# Patient Record
Sex: Female | Born: 1970
Health system: Southern US, Community
[De-identification: ages and names within clinical notes are randomized; demographics above are authoritative.]

## PROBLEM LIST (undated history)

## (undated) DIAGNOSIS — J45909 Unspecified asthma, uncomplicated: Secondary | ICD-10-CM

## (undated) DIAGNOSIS — K219 Gastro-esophageal reflux disease without esophagitis: Secondary | ICD-10-CM

## (undated) HISTORY — PX: CHOLECYSTECTOMY: SHX55

## (undated) HISTORY — PX: TONSILLECTOMY: SUR1361

## (undated) HISTORY — PX: HAND SURGERY: SHX662

---

## 1998-02-06 ENCOUNTER — Inpatient Hospital Stay (HOSPITAL_COMMUNITY): Admission: AD | Admit: 1998-02-06 | Discharge: 1998-02-10 | Payer: Self-pay | Admitting: Obstetrics & Gynecology

## 1998-02-20 ENCOUNTER — Encounter: Admission: RE | Admit: 1998-02-20 | Discharge: 1998-04-01 | Payer: Self-pay | Admitting: Obstetrics & Gynecology

## 1998-09-12 ENCOUNTER — Encounter: Payer: Self-pay | Admitting: Neurosurgery

## 1998-09-12 ENCOUNTER — Ambulatory Visit (HOSPITAL_COMMUNITY): Admission: RE | Admit: 1998-09-12 | Discharge: 1998-09-12 | Payer: Self-pay | Admitting: Neurosurgery

## 1999-04-06 ENCOUNTER — Other Ambulatory Visit: Admission: RE | Admit: 1999-04-06 | Discharge: 1999-04-06 | Payer: Self-pay | Admitting: Obstetrics & Gynecology

## 2000-04-06 ENCOUNTER — Other Ambulatory Visit: Admission: RE | Admit: 2000-04-06 | Discharge: 2000-04-06 | Payer: Self-pay | Admitting: Obstetrics & Gynecology

## 2000-06-05 ENCOUNTER — Emergency Department (HOSPITAL_COMMUNITY): Admission: EM | Admit: 2000-06-05 | Discharge: 2000-06-05 | Payer: Self-pay | Admitting: Emergency Medicine

## 2001-04-12 ENCOUNTER — Other Ambulatory Visit: Admission: RE | Admit: 2001-04-12 | Discharge: 2001-04-12 | Payer: Self-pay | Admitting: Obstetrics & Gynecology

## 2002-06-07 ENCOUNTER — Other Ambulatory Visit: Admission: RE | Admit: 2002-06-07 | Discharge: 2002-06-07 | Payer: Self-pay | Admitting: Obstetrics & Gynecology

## 2003-06-21 ENCOUNTER — Other Ambulatory Visit: Admission: RE | Admit: 2003-06-21 | Discharge: 2003-06-21 | Payer: Self-pay | Admitting: Obstetrics & Gynecology

## 2003-07-09 ENCOUNTER — Encounter: Admission: RE | Admit: 2003-07-09 | Discharge: 2003-07-09 | Payer: Self-pay | Admitting: Family Medicine

## 2003-07-09 ENCOUNTER — Encounter: Payer: Self-pay | Admitting: Family Medicine

## 2004-07-15 ENCOUNTER — Other Ambulatory Visit: Admission: RE | Admit: 2004-07-15 | Discharge: 2004-07-15 | Payer: Self-pay | Admitting: Obstetrics & Gynecology

## 2004-11-17 ENCOUNTER — Emergency Department (HOSPITAL_COMMUNITY): Admission: EM | Admit: 2004-11-17 | Discharge: 2004-11-17 | Payer: Self-pay | Admitting: Emergency Medicine

## 2007-01-18 ENCOUNTER — Ambulatory Visit (HOSPITAL_COMMUNITY): Admission: RE | Admit: 2007-01-18 | Discharge: 2007-01-18 | Payer: Self-pay | Admitting: *Deleted

## 2007-01-20 ENCOUNTER — Encounter: Admission: RE | Admit: 2007-01-20 | Discharge: 2007-01-20 | Payer: Self-pay | Admitting: *Deleted

## 2007-01-28 ENCOUNTER — Encounter (INDEPENDENT_AMBULATORY_CARE_PROVIDER_SITE_OTHER): Payer: Self-pay | Admitting: Specialist

## 2007-01-28 ENCOUNTER — Inpatient Hospital Stay (HOSPITAL_COMMUNITY): Admission: EM | Admit: 2007-01-28 | Discharge: 2007-01-29 | Payer: Self-pay | Admitting: Emergency Medicine

## 2009-12-10 ENCOUNTER — Emergency Department (HOSPITAL_COMMUNITY): Admission: EM | Admit: 2009-12-10 | Discharge: 2009-12-10 | Payer: Self-pay | Admitting: Emergency Medicine

## 2010-11-25 ENCOUNTER — Other Ambulatory Visit
Admission: RE | Admit: 2010-11-25 | Discharge: 2010-11-25 | Payer: Self-pay | Source: Home / Self Care | Admitting: Internal Medicine

## 2011-03-22 LAB — POCT I-STAT, CHEM 8
BUN: 7 mg/dL (ref 6–23)
Calcium, Ion: 1.18 mmol/L (ref 1.12–1.32)
Chloride: 105 meq/L (ref 96–112)
Creatinine, Ser: 0.6 mg/dL (ref 0.4–1.2)
Glucose, Bld: 94 mg/dL (ref 70–99)
HCT: 41 % (ref 36.0–46.0)
Hemoglobin: 13.9 g/dL (ref 12.0–15.0)
Potassium: 3.6 meq/L (ref 3.5–5.1)
Sodium: 141 mEq/L (ref 135–145)
TCO2: 30 mmol/L (ref 0–100)

## 2011-03-22 LAB — URINALYSIS, ROUTINE W REFLEX MICROSCOPIC
Bilirubin Urine: NEGATIVE
Glucose, UA: NEGATIVE mg/dL
Hgb urine dipstick: NEGATIVE
Protein, ur: NEGATIVE mg/dL
Urobilinogen, UA: 0.2 mg/dL (ref 0.0–1.0)

## 2011-05-07 NOTE — Op Note (Signed)
[name], [name]                  ACCOUNT NO.:  [id number]   MEDICAL RECORD NO.:  [id number]          PATIENT TYPE:  INP   LOCATION:  5727                         FACILITY:  [facility]   PHYSICIAN:  Gabrielle Dare. Janee Morn, M.D.DATE OF BIRTH:  September 17, 1971   DATE OF PROCEDURE:  01/28/2007  DATE OF DISCHARGE:  01/29/2007                               OPERATIVE REPORT   PREOPERATIVE DIAGNOSIS:  Acute cholecystitis.   POSTOP DIAGNOSIS:  Acute cholecystitis.   PROCEDURE:  Laparoscopic cholecystectomy with intraoperative  cholangiogram.   SURGEON:  Gabrielle Dare. Janee Morn, M.D.   ASSISTANT:  Thornton Park. Daphine Deutscher, MD   HISTORY OF PRESENT ILLNESS:  The patient was a 40 year old white female  who presented to the emergency department, today, with acute  cholecystitis.  She was brought for emergency cholecystectomy.   PROCEDURE IN DETAIL:  Informed consent was obtained.  The patient  received intravenous antibiotics.  She was brought to the operating  room.  General anesthesia was administered.  Her abdomen was prepped and  draped in a sterile fashion.  Infraumbilical area was infiltrated with  1/4% Marcaine with epinephrine.  An infraumbilical incision was made.  Subcutaneous tissues were dissected down revealing the anterior fascia.  This was divided sharply.  Peritoneal cavity was entered under direct  vision without difficulty.  A #0 Vicryl pursestring suture was placed  around the fascial opening; and the Hassan trocar was inserted into the  abdomen.  The abdomen was insufflated with carbon dioxide in the  standard fashion.  Laparoscopic exploration revealed some endometriosis  implants in the right lower quadrant.  Pictures were taken.   The dome of the gallbladder was retracted superomedially.  Note, the  infundibulum was retracted inferolaterally.  Dissection began laterally  and progressed medially, identifying the cystic duct with an overlying  cystic artery.  The cystic artery was clipped  twice proximally and once  distally and divided.  The cystic duct was circumferentially dissected  until a large window was created between the infundibulum; and the  cystic duct on the liver.  Once this was accomplished, with excellent  visualization, a clip was placed on the infundibulum cystic duct  junction.  Intraoperative cholangiogram was obtained.  This demonstrated  no common bile duct filling defects; and good flow of contrast into the  duodenum.  Cholangiogram catheter was removed.  Three clips were placed  proximally on the cystic duct; and it was divided.   The gallbladder was taken off the liver bed with Bovie cautery.  We  encountered posterior branch of the cystic artery.  This was clipped  twice proximally, once distally and divided.  Gallbladder was removed  from the liver bed with Bovie cautery.  Excellent hemostasis was  obtained along the way.  The gallbladder was placed in the EndoCatch  bag; and removed from the abdomen via the infraumbilical port site.  The  abdomen was copiously irrigated, irrigation and fluid returned clear.  The liver bed was checked; and there was no bleeding; it was dry.  The  clips remained in good position.  Ports were removed under  direct  vision.  After evacuating the remainder of the irrigation fluid, 1/4%  Marcaine was used at all port sites.  The pneumoperitoneum was released.  All 4 wounds were subsequently irrigated. The [facility] trocar was removed.  The infraumbilical fascia was closed by tying a #0 Vicryl pursestring  suture.  All four wounds were copiously irrigated.  Skin of each was  closed with a running 4-0 Vicryl  subcuticular stitch.  Sponge, needle, and instrument counts were  correct.  Benzoin, Steri-Strips and sterile dressings were applied.  The  patient tolerated the procedure well, without apparent complication; and  was taken recovery room in stable condition.      Gabrielle Dare Janee Morn, M.D.  Electronically  Signed     BET/MEDQ  D:  01/28/2007  T:  01/29/2007  Job:  [id number]

## 2011-05-07 NOTE — H&P (Signed)
[name], [name]                  ACCOUNT NO.:  [id number]   MEDICAL RECORD NO.:  [id number]          PATIENT TYPE:  INP   LOCATION:  5727                         FACILITY:  [facility]   PHYSICIAN:  Gabrielle Dare. Janee Morn, M.D.DATE OF BIRTH:  [birth year]   DATE OF ADMISSION:  01/28/2007  DATE OF DISCHARGE:                              HISTORY & PHYSICAL   CHIEF COMPLAINT:  Right upper quadrant pain, nausea and vomiting.   HISTORY OF PRESENT ILLNESS:  The patient is a 40 year old white female  who complains of episodic right upper quadrant pain for several years.  It has become worse over the past 3 weeks.  She had ultrasound done on  February 1 showing gallstones with mild gallbladder wall thickening.  She has also had some EGD workup with Dr. Virginia Rochester which she claims is  unremarkable.  She has been feeling poorly since last week.  The right  upper quadrant pain, nausea, and vomiting became worse last night. She  came to emergency department for evaluation.  Pain continued despite  medications in the emergency department, and we are asked to admit.   PAST MEDICAL HISTORY:  Hypothyroidism and asthma.   SOCIAL HISTORY:  She works as a Engineer, civil (consulting) at [employer]. She does not  smoke.  She does not drink alcohol.   ALLERGIES:  PENICILLIN.   MEDICATIONS:  None.  She has a thyroid medicine prescribed, but she does  not take it.   REVIEW OF SYSTEMS:  Is significant in the GI section for right upper  quadrant pain, nausea and vomiting, otherwise negative.   PHYSICAL EXAMINATION:  VITAL SIGNS: Temperature 98.2, pulse 93,  respirations 20, blood pressure 128/85.  GENERAL: She is awake, alert and in mild distress.  NECK:  Supple with no masses.  LUNGS:  Clear to auscultation with no acute wheezing.  HEART: Regular with no murmurs and pulses palpable in the left chest.  ABDOMEN:  Has some right upper quadrant tenderness without guarding.  Bowel sounds are present. It is soft. No other masses are noted.  EXTREMITIES:  Have no significant edema.  SKIN: Warm and dry with no rashes.   LABORATORY STUDIES:  White blood cell count 5.4. Lipase 21.  Liver  function tests within normal limits.  Sodium 140, potassium 3.5,  chloride 105, CO2 28, BUN 5, glucose 97.   IMPRESSION:  A 40 year old female with acute cholecystitis.   PLAN:  1. Admit.  2. Give her intravenous antibiotics.  3. Plan a cholecystectomy today.   Laparoscopic cholecystectomy with intraoperative cholangiogram will be  the plan. Procedure, risks and benefits were discussed in detail with  the patient including but not limited to bleeding, infection bowel and  bile duct Injury.  We also talked about the possibility emergent to over  procedure. She is agreeable.      Gabrielle Dare Janee Morn, M.D.  Electronically Signed     BET/MEDQ  D:  01/28/2007  T:  01/28/2007  Job:  [id number]

## 2011-05-07 NOTE — Op Note (Signed)
NAMEJANEVA, PEASTER                  ACCOUNT NO.:  [id number]   MEDICAL RECORD NO.:  [id number]          PATIENT TYPE:  AMB   LOCATION:  ENDO                         FACILITY:  [facility]   PHYSICIAN:  Georgiana Spinner, M.D.    DATE OF BIRTH:  06-Apr-1971   DATE OF PROCEDURE:  01/18/2007  DATE OF DISCHARGE:                               OPERATIVE REPORT   PROCEDURE:  Upper endoscopy.   INDICATIONS:  Abdominal pain.   ANESTHESIA:  Fentanyl 60 mcg, Versed 5 mg.   PROCEDURE:  With the patient mildly sedated in the left lateral  decubitus position, the Pentax videoscopic endoscope was inserted and  passed under direct vision through the esophagus, which appeared normal  into the stomach.  Fundus, body, antrum, duodenal bulb, second portion  duodenum were visualized and all appeared normal.  From this point the  endoscope was slowly withdrawn taking circumferential views of duodenal  mucosa until the endoscope been pulled back in the stomach, placed in  retroflexion to view the stomach from below.  The endoscope was  straightened and withdrawn taking circumferential views of the remaining  gastric and esophageal mucosa.  The patient's vital signs, pulse  oximeter remained stable.  The patient tolerated procedure well without  apparent complications.   FINDINGS:  Unremarkable examination.   PLAN:  Have patient follow-up with me as an outpatient.           ______________________________  Georgiana Spinner, M.D.     GMO/MEDQ  D:  01/18/2007  T:  01/18/2007  Job:  [id number]

## 2011-05-07 NOTE — Discharge Summary (Signed)
[name], [name]                  ACCOUNT NO.:  [id number]   MEDICAL RECORD NO.:  [id number]          PATIENT TYPE:  INP   LOCATION:  5727                         FACILITY:  [facility]   PHYSICIAN:  Thornton Park. Daphine Deutscher, MD  DATE OF BIRTH:  12/08/1971   DATE OF ADMISSION:  01/28/2007  DATE OF DISCHARGE:  01/29/2007                               DISCHARGE SUMMARY   ADMITTING DIAGNOSIS:  Acute cholecystitis.   DISCHARGE DIAGNOSES:  1. Acute cholecystitis.  2. Endometriosis seen laparoscopically.   COURSE IN THE HOSPITAL:  [name] is a 40 year old lady who was  admitted on January 28, 2007 and was taken to the OR by Dr. Violeta Gelinas and by Dr. Daphine Deutscher.  At the time of her surgery she was noted to  have acute cholecystitis and had a laparoscopic cholecystectomy, but  also points around her abdomen active areas of endometriosis were seen.  She postoperatively did well and was ready for discharge on January 29, 2007.   FOLLOWUP:  In the office in approximately 1-2 weeks.   CONDITION:  Good.   FINAL DIAGNOSES:  1. Acute cholecystitis.  2. Active endometriosis.      Thornton Park Daphine Deutscher, MD  Electronically Signed     MBM/MEDQ  D:  03/26/2007  T:  03/27/2007  Job:  [id number]

## 2013-08-22 ENCOUNTER — Other Ambulatory Visit (HOSPITAL_COMMUNITY)
Admission: RE | Admit: 2013-08-22 | Discharge: 2013-08-22 | Disposition: A | Discharge status: Home / Self Care | Payer: 59 | Source: Ambulatory Visit | Attending: Internal Medicine | Admitting: Internal Medicine

## 2013-08-22 DIAGNOSIS — Z01419 Encounter for gynecological examination (general) (routine) without abnormal findings: Secondary | ICD-10-CM | POA: Insufficient documentation

## 2013-08-27 ENCOUNTER — Other Ambulatory Visit: Payer: Self-pay | Admitting: Internal Medicine

## 2013-08-27 DIAGNOSIS — R102 Pelvic and perineal pain: Secondary | ICD-10-CM

## 2013-09-04 ENCOUNTER — Ambulatory Visit
Admission: RE | Admit: 2013-09-04 | Discharge: 2013-09-04 | Disposition: A | Discharge status: Home / Self Care | Payer: 59 | Source: Ambulatory Visit | Attending: Internal Medicine | Admitting: Internal Medicine

## 2013-09-04 DIAGNOSIS — R102 Pelvic and perineal pain: Secondary | ICD-10-CM

## 2014-03-11 ENCOUNTER — Encounter (HOSPITAL_COMMUNITY): Payer: Self-pay | Admitting: Pharmacist

## 2014-03-13 ENCOUNTER — Encounter (HOSPITAL_COMMUNITY): Payer: Self-pay

## 2014-03-16 NOTE — H&P (Signed)
[name] is an 43 y.o. female. She presents for laparoscopic tubal ligation  Pertinent Gynecological History: Menses: regular every 28-35 days without intermenstrual spotting Last pap: normal Date: 2014 OB History: G1, P1000   Menstrual History: Menarche age: [age]  Patient's last menstrual period was 03/02/2014.    Past Medical History  Diagnosis Date  . Asthma   . GERD (gastroesophageal reflux disease)     Past Surgical History  Procedure Laterality Date  . Cholecystectomy    . Cesarean section    . Hand surgery    . Tonsillectomy      childhood    History reviewed. No pertinent family history.  Social History:  reports that she has never smoked. She has never used smokeless tobacco. She reports that she does not drink alcohol or use illicit drugs.  Allergies:  Allergies  Allergen Reactions  . Lobster [Shellfish Allergy] Anaphylaxis  . Penicillins Other (See Comments)    Unknown childhood reaction    No prescriptions prior to admission    Review of Systems  Constitutional: Negative.   HENT: Negative.   Cardiovascular: Negative.   Genitourinary: Negative.   Neurological: Negative.     Last menstrual period 03/02/2014. Physical Exam  Constitutional: She appears well-developed.  HENT:  Head: Normocephalic.  Eyes: Pupils are equal, round, and reactive to light.  Neck: Normal range of motion. No thyromegaly present.  Cardiovascular: Normal rate.   Respiratory: Effort normal.  GI: Soft.  Genitourinary: Vagina normal and uterus normal.  Neurological: She is alert.  Skin: Skin is warm and dry.    Assessment/Plan: Desires permanent sterilization procedure.  Discussed alternatives and failure risks.  Will proceed with bilateral tubal cautery for sterilization.  Mickel BaasKAPLAN,Jalaina Salyers D 03/16/2014, 9:28 PM

## 2014-03-18 ENCOUNTER — Ambulatory Visit (HOSPITAL_COMMUNITY)
Admission: RE | Admit: 2014-03-18 | Discharge: 2014-03-18 | Disposition: A | Discharge status: Home / Self Care | Payer: BC Managed Care – PPO | Source: Ambulatory Visit | Attending: Obstetrics & Gynecology | Admitting: Obstetrics & Gynecology

## 2014-03-18 ENCOUNTER — Encounter (HOSPITAL_COMMUNITY): Payer: Self-pay

## 2014-03-18 ENCOUNTER — Encounter (HOSPITAL_COMMUNITY)
Admission: RE | Disposition: A | Discharge status: Home / Self Care | Payer: Self-pay | Source: Ambulatory Visit | Attending: Obstetrics & Gynecology

## 2014-03-18 ENCOUNTER — Ambulatory Visit (HOSPITAL_COMMUNITY): Payer: BC Managed Care – PPO | Admitting: Anesthesiology

## 2014-03-18 ENCOUNTER — Encounter (HOSPITAL_COMMUNITY): Payer: BC Managed Care – PPO | Admitting: Anesthesiology

## 2014-03-18 DIAGNOSIS — Z302 Encounter for sterilization: Secondary | ICD-10-CM | POA: Insufficient documentation

## 2014-03-18 DIAGNOSIS — K219 Gastro-esophageal reflux disease without esophagitis: Secondary | ICD-10-CM | POA: Insufficient documentation

## 2014-03-18 DIAGNOSIS — J45909 Unspecified asthma, uncomplicated: Secondary | ICD-10-CM | POA: Insufficient documentation

## 2014-03-18 HISTORY — DX: Unspecified asthma, uncomplicated: J45.909

## 2014-03-18 HISTORY — DX: Gastro-esophageal reflux disease without esophagitis: K21.9

## 2014-03-18 HISTORY — PX: LAPAROSCOPIC TUBAL LIGATION: SHX1937

## 2014-03-18 LAB — CBC
HCT: 46 % (ref 36.0–46.0)
Hemoglobin: 16.1 g/dL — ABNORMAL HIGH (ref 12.0–15.0)
MCH: 32.7 pg (ref 26.0–34.0)
MCHC: 35 g/dL (ref 30.0–36.0)
MCV: 93.5 fL (ref 78.0–100.0)
Platelets: 205 10*3/uL (ref 150–400)
RBC: 4.92 MIL/uL (ref 3.87–5.11)
RDW: 13.7 % (ref 11.5–15.5)
WBC: 9 10*3/uL (ref 4.0–10.5)

## 2014-03-18 LAB — PREGNANCY, URINE: PREG TEST UR: NEGATIVE

## 2014-03-18 SURGERY — LIGATION, FALLOPIAN TUBE, LAPAROSCOPIC
Anesthesia: General | Site: Abdomen

## 2014-03-18 MED ORDER — FENTANYL CITRATE 0.05 MG/ML IJ SOLN
INTRAMUSCULAR | Status: AC
  Filled 2014-03-18: qty 5

## 2014-03-18 MED ORDER — ONDANSETRON HCL 4 MG/2ML IJ SOLN
INTRAMUSCULAR | Status: AC
  Filled 2014-03-18: qty 2

## 2014-03-18 MED ORDER — PROPOFOL 10 MG/ML IV BOLUS
INTRAVENOUS | Status: DC | PRN
  Administered 2014-03-18: 200 mg via INTRAVENOUS

## 2014-03-18 MED ORDER — KETOROLAC TROMETHAMINE 30 MG/ML IJ SOLN
INTRAMUSCULAR | Status: AC
  Filled 2014-03-18: qty 1

## 2014-03-18 MED ORDER — FENTANYL CITRATE 0.05 MG/ML IJ SOLN
INTRAMUSCULAR | Status: AC
  Filled 2014-03-18: qty 2

## 2014-03-18 MED ORDER — LIDOCAINE HCL (CARDIAC) 20 MG/ML IV SOLN
INTRAVENOUS | Status: AC
  Filled 2014-03-18: qty 5

## 2014-03-18 MED ORDER — FENTANYL CITRATE 0.05 MG/ML IJ SOLN
INTRAMUSCULAR | Status: DC | PRN
  Administered 2014-03-18 (×2): 50 ug via INTRAVENOUS

## 2014-03-18 MED ORDER — PROPOFOL 10 MG/ML IV EMUL
INTRAVENOUS | Status: AC
  Filled 2014-03-18: qty 20

## 2014-03-18 MED ORDER — NEOSTIGMINE METHYLSULFATE 1 MG/ML IJ SOLN
INTRAMUSCULAR | Status: AC
  Filled 2014-03-18: qty 1

## 2014-03-18 MED ORDER — LACTATED RINGERS IV SOLN: INTRAVENOUS | Status: DC

## 2014-03-18 MED ORDER — LACTATED RINGERS IV SOLN
INTRAVENOUS | Status: DC
  Administered 2014-03-18: 10:00:00 via INTRAVENOUS

## 2014-03-18 MED ORDER — GLYCOPYRROLATE 0.2 MG/ML IJ SOLN
INTRAMUSCULAR | Status: AC
  Filled 2014-03-18: qty 3

## 2014-03-18 MED ORDER — HYDROCODONE-ACETAMINOPHEN 5-325 MG PO TABS: 1.0000 | ORAL_TABLET | ORAL | Status: DC | PRN

## 2014-03-18 MED ORDER — KETOROLAC TROMETHAMINE 30 MG/ML IJ SOLN
INTRAMUSCULAR | Status: DC | PRN
  Administered 2014-03-18: 30 mg via INTRAVENOUS

## 2014-03-18 MED ORDER — LIDOCAINE HCL (CARDIAC) 20 MG/ML IV SOLN
INTRAVENOUS | Status: DC | PRN
  Administered 2014-03-18: 100 mg via INTRAVENOUS

## 2014-03-18 MED ORDER — MIDAZOLAM HCL 2 MG/2ML IJ SOLN
INTRAMUSCULAR | Status: DC | PRN
  Administered 2014-03-18: 2 mg via INTRAVENOUS

## 2014-03-18 MED ORDER — GLYCOPYRROLATE 0.2 MG/ML IJ SOLN
INTRAMUSCULAR | Status: DC | PRN
  Administered 2014-03-18: .6 mg via INTRAVENOUS

## 2014-03-18 MED ORDER — DEXAMETHASONE SODIUM PHOSPHATE 10 MG/ML IJ SOLN
INTRAMUSCULAR | Status: DC | PRN
  Administered 2014-03-18: 10 mg via INTRAVENOUS

## 2014-03-18 MED ORDER — NEOSTIGMINE METHYLSULFATE 1 MG/ML IJ SOLN
INTRAMUSCULAR | Status: DC | PRN
  Administered 2014-03-18: 3 mg via INTRAVENOUS

## 2014-03-18 MED ORDER — ROCURONIUM BROMIDE 100 MG/10ML IV SOLN
INTRAVENOUS | Status: AC
  Filled 2014-03-18: qty 1

## 2014-03-18 MED ORDER — ONDANSETRON HCL 4 MG/2ML IJ SOLN
INTRAMUSCULAR | Status: DC | PRN
  Administered 2014-03-18: 4 mg via INTRAVENOUS

## 2014-03-18 MED ORDER — MIDAZOLAM HCL 2 MG/2ML IJ SOLN
INTRAMUSCULAR | Status: AC
  Filled 2014-03-18: qty 2

## 2014-03-18 MED ORDER — DEXAMETHASONE SODIUM PHOSPHATE 10 MG/ML IJ SOLN
INTRAMUSCULAR | Status: AC
  Filled 2014-03-18: qty 1

## 2014-03-18 MED ORDER — ROCURONIUM BROMIDE 100 MG/10ML IV SOLN
INTRAVENOUS | Status: DC | PRN
  Administered 2014-03-18: 30 mg via INTRAVENOUS

## 2014-03-18 MED ORDER — FENTANYL CITRATE 0.05 MG/ML IJ SOLN
25.0000 ug | INTRAMUSCULAR | Status: DC | PRN
  Administered 2014-03-18: 50 ug via INTRAVENOUS

## 2014-03-18 SURGICAL SUPPLY — 15 items
ADH SKN CLS APL DERMABOND .7 (GAUZE/BANDAGES/DRESSINGS) ×1
CATH ROBINSON RED A/P 16FR (CATHETERS) ×2 IMPLANT
CLOTH BEACON ORANGE TIMEOUT ST (SAFETY) ×2 IMPLANT
DERMABOND ADVANCED (GAUZE/BANDAGES/DRESSINGS) ×1
DERMABOND ADVANCED .7 DNX12 (GAUZE/BANDAGES/DRESSINGS) ×1 IMPLANT
GLOVE ECLIPSE 6.0 STRL STRAW (GLOVE) ×2 IMPLANT
GLOVE ECLIPSE 6.5 STRL STRAW (GLOVE) ×2 IMPLANT
GOWN STRL REUS W/TWL LRG LVL3 (GOWN DISPOSABLE) ×4 IMPLANT
NEEDLE INSUFFLATION 120MM (ENDOMECHANICALS) ×1 IMPLANT
PACK LAPAROSCOPY BASIN (CUSTOM PROCEDURE TRAY) ×2 IMPLANT
SUT VICRYL 4-0 PS2 18IN ABS (SUTURE) ×1 IMPLANT
TOWEL OR 17X24 6PK STRL BLUE (TOWEL DISPOSABLE) ×4 IMPLANT
TROCAR OPTI TIP 5M 100M (ENDOMECHANICALS) ×2 IMPLANT
WARMER LAPAROSCOPE (MISCELLANEOUS) ×2 IMPLANT
WATER STERILE IRR 1000ML POUR (IV SOLUTION) ×2 IMPLANT

## 2014-03-18 NOTE — Discharge Instructions (Signed)
Laparoscopic Tubal Ligation  Care After   DO NOT TAKE IBUPROFEN (ALEVE, ADVIL, OR MOTRIN) TILL AFTER 6:20 PM!  Refer to this sheet in the next few weeks. These instructions provide you with information on caring for yourself after your procedure. Your caregiver may also give you more specific instructions. Your treatment has been planned according to current medical practices, but problems sometimes occur. Call your caregiver if you have any problems or questions after your procedure.  HOME CARE INSTRUCTIONS  Rest the remainder of the day.  Only take over-the-counter or prescription medicines for pain, discomfort, or fever as directed by your caregiver. Do not take aspirin. It can cause bleeding.  Gradually resume daily activities, diet, rest, driving, and work.  Avoid sexual intercourse for 2 weeks or as directed.  Do not use tampons or douche.  Do not drive while taking pain medicine.  Do not lift anything over 5 pounds for 2 weeks or as directed.  Only take showers, not baths, until you are seen by your caregiver.  Change bandages (dressings) as directed.  Take your temperature twice a day and record it.  Try to have help for the first 7 to 10 days for your household needs.  Return to your caregiver to get your stitches (sutures) removed and for follow-up visits as directed. SEEK MEDICAL CARE IF:  You have redness, swelling, or increasing pain in a wound.  You have drainage from a wound lasting longer than 1 day.  Your pain is getting worse.  You have a rash.  You become dizzy or lightheaded.  You have a reaction to your medicine.  You need stronger medicine or a change in your pain medicine.  You notice a bad smell coming from a wound or dressing.  Your wound breaks open after the sutures have been removed.  You are constipated. SEEK IMMEDIATE MEDICAL CARE IF:  You faint.  You have a fever.  You have increasing abdominal pain.  You have severe pain in your shoulders.  You have  bleeding or drainage from the suture sites or vagina following surgery.  You have shortness of breath or difficulty breathing.  You have chest or leg pain.  You have persistent nausea, vomiting, or diarrhea. MAKE SURE YOU:  Understand these instructions.  Watch your condition.  Get help right away if you are not doing well or get worse.

## 2014-03-18 NOTE — Progress Notes (Signed)
I have interviewed and performed the pertinent exams on my patient to confirm that there have been no significant changes in her condition since the dictation of her history and physical exam.  

## 2014-03-18 NOTE — Anesthesia Postprocedure Evaluation (Signed)
  Anesthesia Post-op Note  Patient: [name]  Procedure(s) Performed: Procedure(s): LAPAROSCOPIC TUBAL LIGATION (N/A)  Patient Location: PACU  Anesthesia Type:General   Level of Consciousness: awake, alert  and oriented  Airway and Oxygen Therapy: Patient Spontanous Breathing  Post-op Pain: none  Post-op Assessment: Post-op Vital signs reviewed, Patient's Cardiovascular Status Stable, Respiratory Function Stable, Patent Airway, No signs of Nausea or vomiting and Pain level controlled  Post-op Vital Signs: Reviewed and stable  Complications: No apparent anesthesia complications

## 2014-03-18 NOTE — Anesthesia Preprocedure Evaluation (Signed)
Anesthesia Evaluation  Patient identified by MRN, date of birth, ID band Patient awake    Reviewed: Allergy & Precautions, H&P , Patient's Chart, lab work & pertinent test results, reviewed documented beta blocker date and time   Airway Mallampati: II TM Distance: >3 FB Neck ROM: full    Dental no notable dental hx.    Pulmonary asthma ,  breath sounds clear to auscultation  Pulmonary exam normal       Cardiovascular Rhythm:regular Rate:Normal     Neuro/Psych    GI/Hepatic   Endo/Other  Morbid obesity  Renal/GU      Musculoskeletal   Abdominal   Peds  Hematology   Anesthesia Other Findings   Reproductive/Obstetrics                           Anesthesia Physical Anesthesia Plan  ASA: III  Anesthesia Plan: General   Post-op Pain Management:    Induction: Intravenous  Airway Management Planned: Oral ETT  Additional Equipment:   Intra-op Plan:   Post-operative Plan: Extubation in OR  Informed Consent: I have reviewed the patients History and Physical, chart, labs and discussed the procedure including the risks, benefits and alternatives for the proposed anesthesia with the patient or authorized representative who has indicated his/her understanding and acceptance.   Dental Advisory Given and Dental advisory given  Plan Discussed with: CRNA and Surgeon  Anesthesia Plan Comments: (  Discussed general anesthesia, including possible nausea, instrumentation of airway, sore throat,pulmonary aspiration, etc. I asked if the were any outstanding questions, or  concerns before we proceeded. )        Anesthesia Quick Evaluation

## 2014-03-18 NOTE — Op Note (Signed)
Patient Name: [name] MRN: [id number]  Date of Surgery: 03/18/2014    PREOPERATIVE DIAGNOSIS: DESIRERS STERILIZATION  POSTOPERATIVE DIAGNOSIS: DESIRERS STERILIZATION   PROCEDURE: Laparoscopy Bilateral tubal cautery for sterilization  SURGEON: Caralyn Guileichard D. Arlyce DiceKaplan M.D.  ASSISTANT:  ANESTHESIA: General Endotracheal  ESTIMATED BLOOD LOSS: Minimal  FINDINGS: Top normal sized uterus.  Filmy pelvic adhesions in the cul de sac and left adnexa.  Normal fallopian tubes.   COMPLICATIONS: None  INDICATIONS: Voluntary sterilization.  PROCEDURE IN DETAIL:  The abdomen, vagina, and perineum were prepped and draped in sterile fashion.  The bladder was catheterized.  A Hulka tenaculum was placed into the endometrial canal and fixed to the anterior lip of the cervix.  The surgeon re- gowned and gloved.  An incision was made at the umbilicus and the Veress needle was introduced and a pneumoperitoneum was created.  The 5 mm laparoscope was was placed through the umbilical port and an additional 5 mm port was placed in the right lower quadrants under direct visualization.  The Kleppinger bipolar forceps was introduced and the tubes were cauterized along a 3-4 cm length bilaterally leaving about 0.5 to 1 cm of normal tube proximal to the fundus.   The pneumoperitoneum was released and the incisions were closed with derma bond.  The Hulka tenaculum was removed.  All sponge and instrument counts were correct.  The patient tolerated the procedure well and left the operating room in good condition.  [name] D. Arlyce DiceKaplan, M.D.

## 2014-03-18 NOTE — Transfer of Care (Signed)
Immediate Anesthesia Transfer of Care Note  Patient: [name]  Procedure(s) Performed: Procedure(s): LAPAROSCOPIC TUBAL LIGATION (N/A)  Patient Location: PACU  Anesthesia Type:General  Level of Consciousness: awake, alert  and oriented  Airway & Oxygen Therapy: Patient Spontanous Breathing and Patient connected to nasal cannula oxygen  Post-op Assessment: Report given to PACU RN and Post -op Vital signs reviewed and stable  Post vital signs: Reviewed and stable  Complications: No apparent anesthesia complications

## 2014-03-19 ENCOUNTER — Encounter (HOSPITAL_COMMUNITY): Payer: Self-pay | Admitting: Obstetrics & Gynecology

## 2014-07-24 ENCOUNTER — Other Ambulatory Visit (HOSPITAL_COMMUNITY): Payer: Self-pay | Admitting: Respiratory Therapy

## 2014-07-24 DIAGNOSIS — J45909 Unspecified asthma, uncomplicated: Secondary | ICD-10-CM

## 2014-07-31 ENCOUNTER — Ambulatory Visit (HOSPITAL_COMMUNITY)
Admission: RE | Admit: 2014-07-31 | Discharge: 2014-07-31 | Disposition: A | Discharge status: Home / Self Care | Payer: BC Managed Care – PPO | Source: Ambulatory Visit | Attending: Internal Medicine | Admitting: Internal Medicine

## 2014-07-31 ENCOUNTER — Encounter (INDEPENDENT_AMBULATORY_CARE_PROVIDER_SITE_OTHER): Payer: Self-pay

## 2014-07-31 DIAGNOSIS — J45909 Unspecified asthma, uncomplicated: Secondary | ICD-10-CM | POA: Diagnosis present

## 2014-07-31 MED ORDER — ALBUTEROL SULFATE (2.5 MG/3ML) 0.083% IN NEBU
2.5000 mg | INHALATION_SOLUTION | Freq: Once | RESPIRATORY_TRACT | Status: AC
  Administered 2014-07-31: 2.5 mg via RESPIRATORY_TRACT

## 2014-08-01 LAB — PULMONARY FUNCTION TEST
DL/VA % pred: 100 %
DL/VA: 4.7 ml/min/mmHg/L
DLCO COR % PRED: 98 %
DLCO UNC: 22.48 ml/min/mmHg
DLCO cor: 22.48 ml/min/mmHg
DLCO unc % pred: 98 %
FEF 25-75 PRE: 3.88 L/s
FEF 25-75 Post: 3.19 L/sec
FEF2575-%CHANGE-POST: -17 %
FEF2575-%PRED-PRE: 131 %
FEF2575-%Pred-Post: 107 %
FEV1-%CHANGE-POST: 0 %
FEV1-%Pred-Post: 107 %
FEV1-%Pred-Pre: 108 %
FEV1-Post: 3.07 L
FEV1-Pre: 3.1 L
FEV1FVC-%Change-Post: 0 %
FEV1FVC-%Pred-Pre: 105 %
FEV6-%Change-Post: 0 %
FEV6-%PRED-POST: 102 %
FEV6-%Pred-Pre: 103 %
FEV6-PRE: 3.58 L
FEV6-Post: 3.55 L
FEV6FVC-%Change-Post: 0 %
FEV6FVC-%PRED-POST: 102 %
FEV6FVC-%PRED-PRE: 102 %
FVC-%Change-Post: 0 %
FVC-%PRED-POST: 100 %
FVC-%PRED-PRE: 101 %
FVC-POST: 3.55 L
FVC-Pre: 3.58 L
POST FEV6/FVC RATIO: 100 %
Post FEV1/FVC ratio: 87 %
Pre FEV1/FVC ratio: 87 %
Pre FEV6/FVC Ratio: 100 %
RV % pred: 104 %
RV: 1.67 L
TLC % pred: 100 %
TLC: 4.93 L

## 2015-01-06 IMAGING — US US PELVIS COMPLETE
1 series · 13 of 25 positions shown · non-contrast
Comparison: CT 12/10/2009.

CLINICAL DATA: History of pelvic pain and adnexal tenderness.
History of previous cesarean section.  LMP 1 month previously.

TRANSABDOMINAL AND TRANSVAGINAL ULTRASOUND OF PELVIS
TECHNIQUE: Both transabdominal and transvaginal ultrasound
examinations of the pelvis were performed. Transabdominal technique
was performed for global imaging of the pelvis including uterus,
ovaries, adnexal regions, and pelvic cul-de-sac.
It was necessary to proceed with endovaginal exam following the
transabdominal exam to visualize the details of the parenchyma of
the myometrium, endometrium, and ovaries..

[Series 1: us pelvis complete · 0.32mm/px · 13 of 42 slices shown]
[im 1/42]
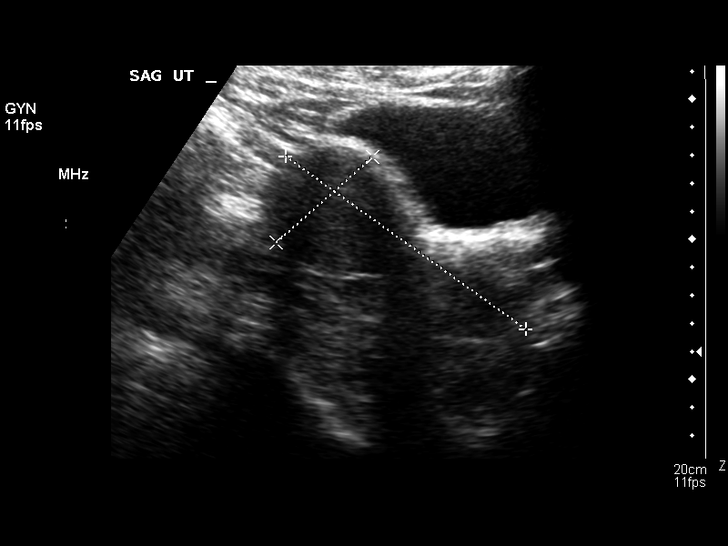
[im 4/42]
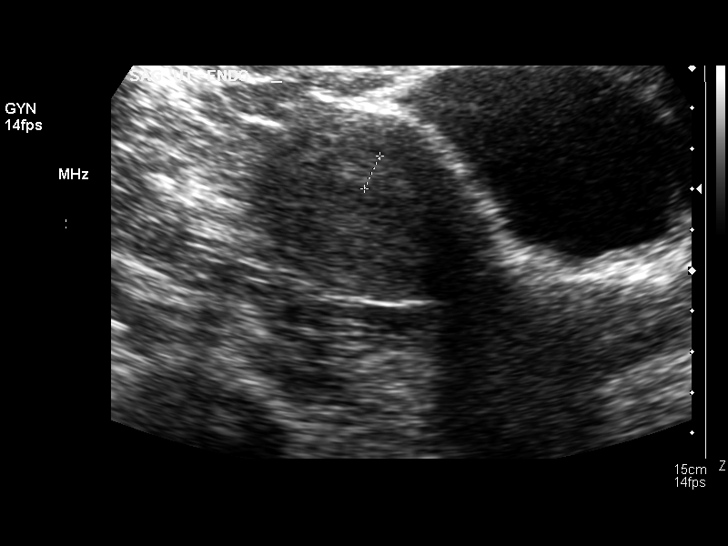
[im 7/42]
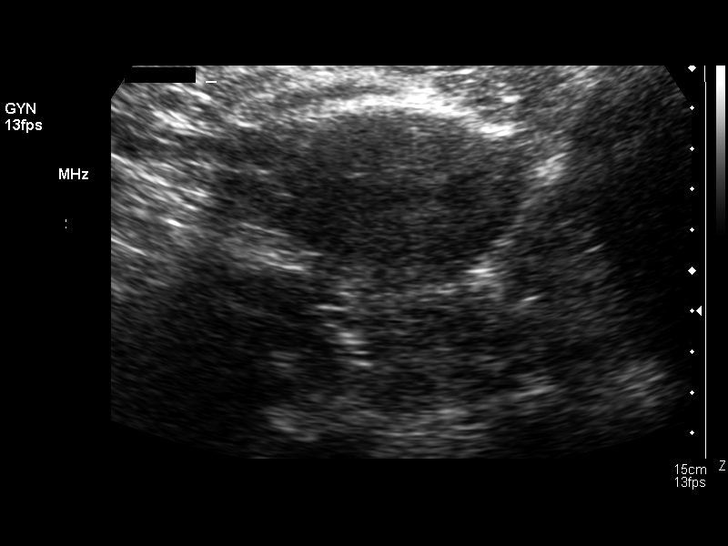
[im 11/42]
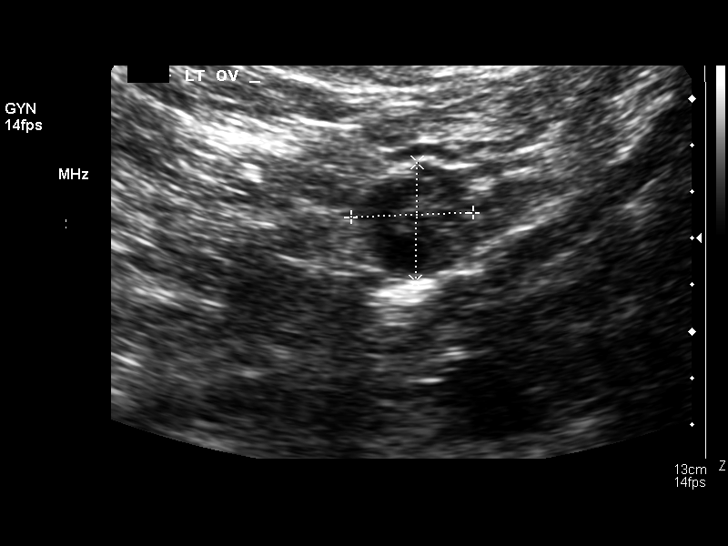
[im 14/42]
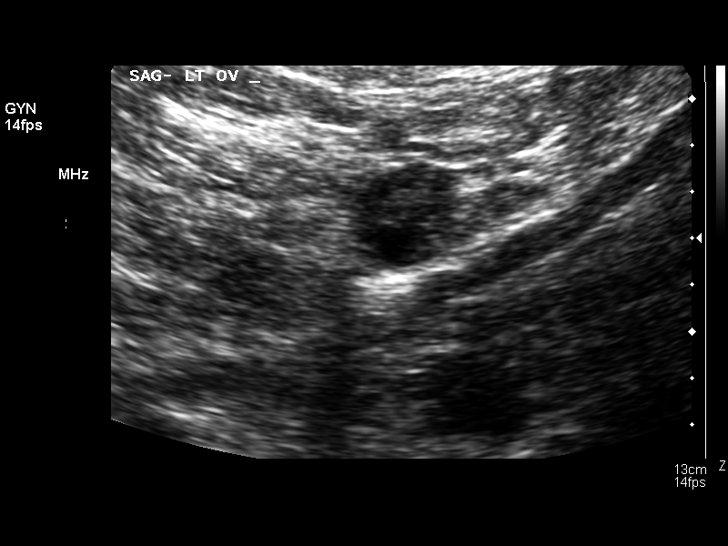
[im 18/42]
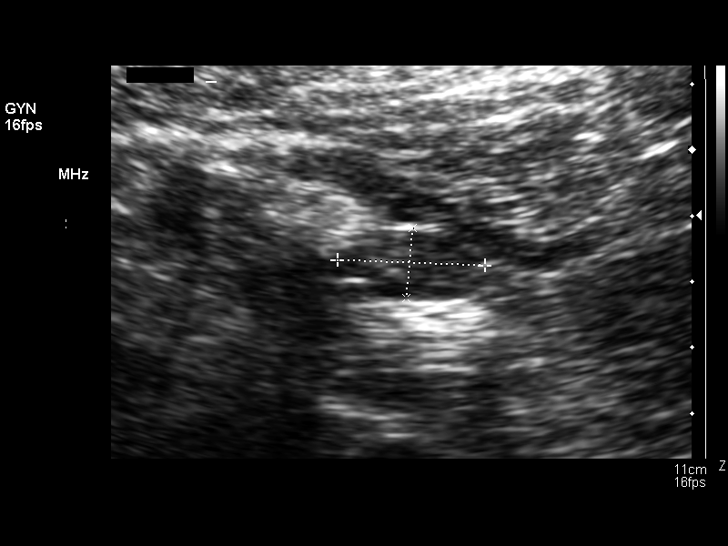
[im 21/42]
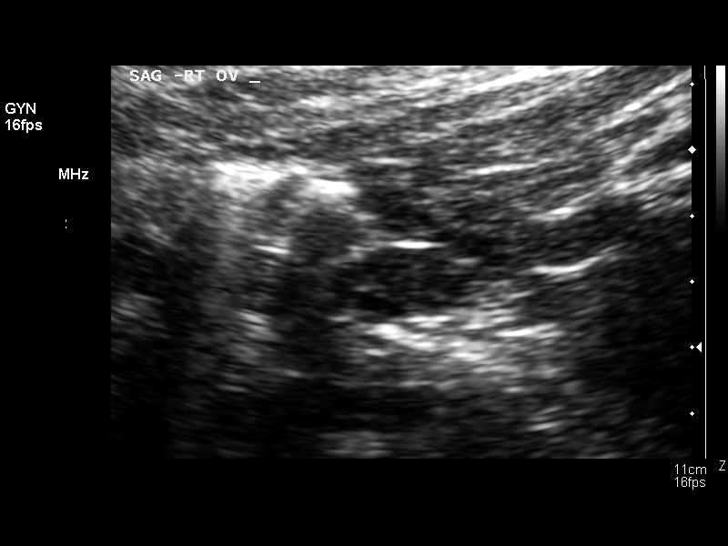
[im 24/42]
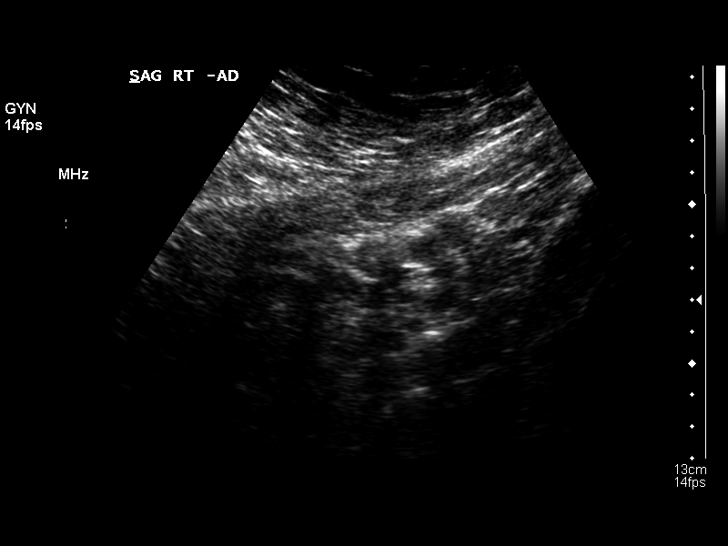
[im 28/42]
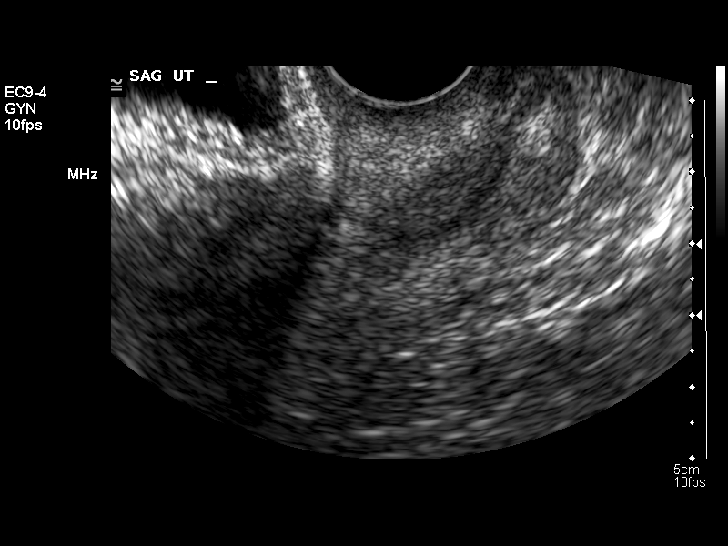
[im 31/42]
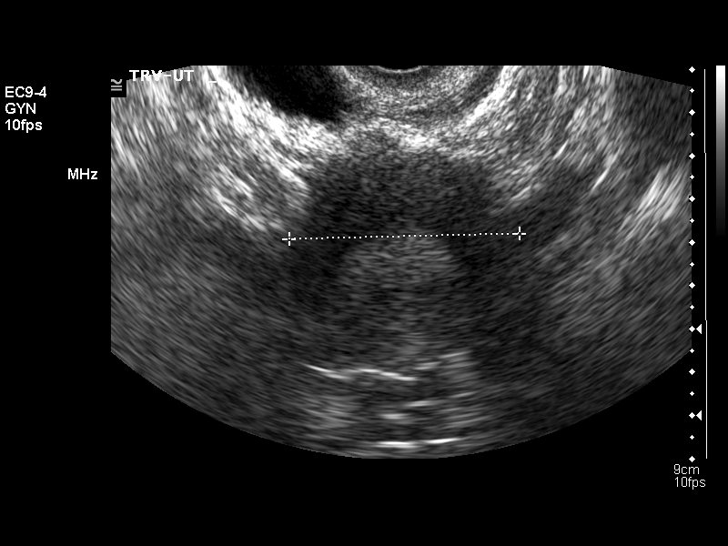
[im 35/42]
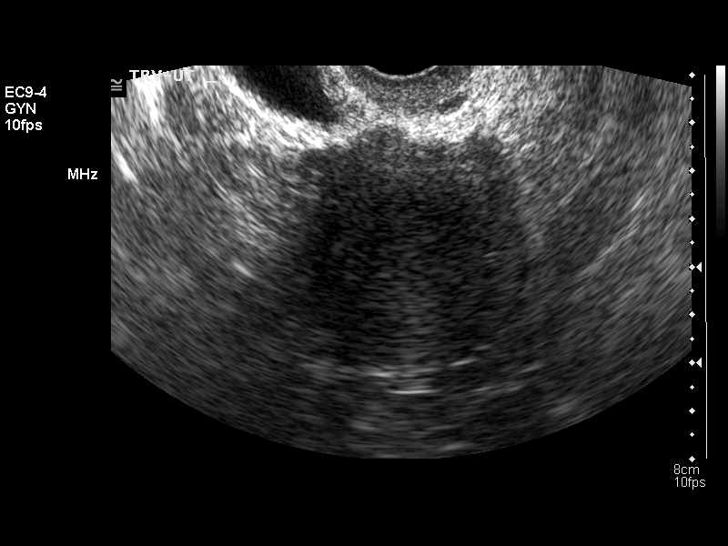
[im 38/42]
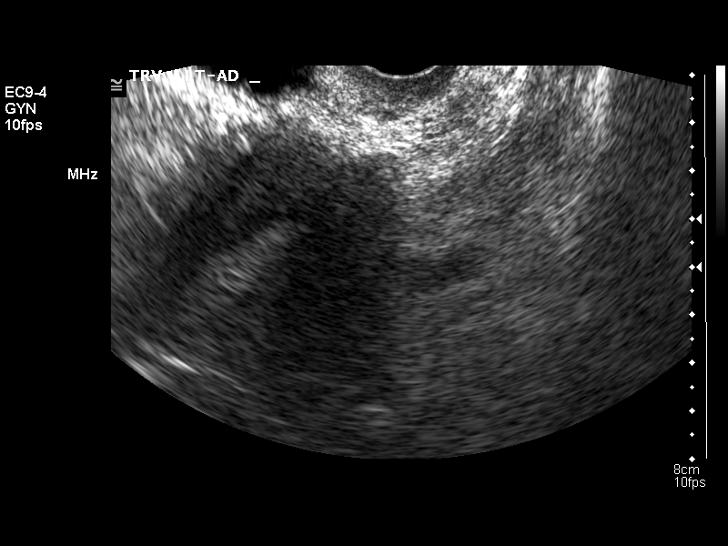
[im 42/42]
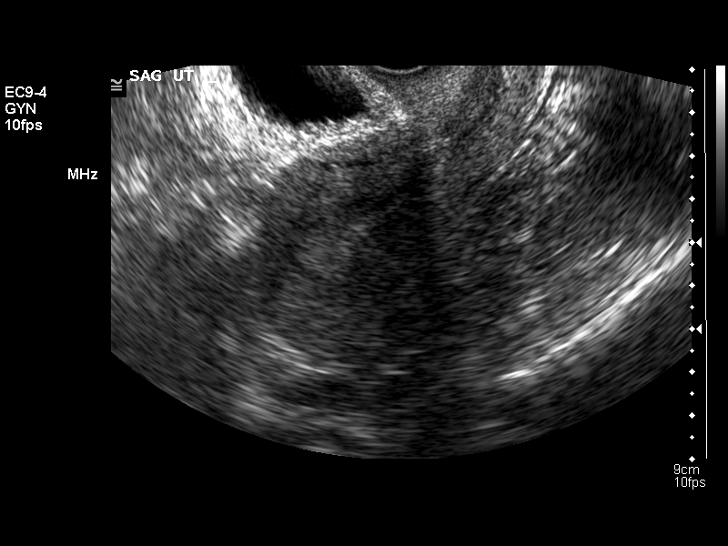

[13 of 25 positions shown; findings below may reference images not displayed]

FINDINGS: Uterus: The uterus is mildly anteverted.  The uterus measures 9.5 x
5.0 x 5.3 cm.  No myometrial abnormality is evident.

Endometrium: Endometrial thickness measures 11.4 mm.  No
endometrial mass or fluid collection is evident.

Right ovary:  Right ovary measures 2.2 x 1.1 x 2.5 cm.  No right
ovarian or right adnexal lesion is identified. With color Doppler,
vascular flow is present within the parenchyma of the right ovary.

Left ovary: Left ovary measures 2.4 x 2.6 x 2.6 cm.  No left
ovarian or left adnexal lesion is identified. With color Doppler,
vascular flow was present within the parenchyma of the left ovary.

Other findings: No free fluid is seen in the pelvis.
IMPRESSION: No pelvic pathology is identified..

## 2015-05-05 ENCOUNTER — Encounter (HOSPITAL_COMMUNITY): Payer: Self-pay

## 2015-05-05 ENCOUNTER — Emergency Department (HOSPITAL_COMMUNITY)
Admission: EM | Admit: 2015-05-05 | Discharge: 2015-05-06 | Disposition: A | Discharge status: Home / Self Care | Payer: BLUE CROSS/BLUE SHIELD | Attending: Emergency Medicine | Admitting: Emergency Medicine

## 2015-05-05 DIAGNOSIS — J45909 Unspecified asthma, uncomplicated: Secondary | ICD-10-CM | POA: Insufficient documentation

## 2015-05-05 DIAGNOSIS — Z880 Allergy status to penicillin: Secondary | ICD-10-CM | POA: Insufficient documentation

## 2015-05-05 DIAGNOSIS — M545 Low back pain: Secondary | ICD-10-CM | POA: Diagnosis present

## 2015-05-05 DIAGNOSIS — K219 Gastro-esophageal reflux disease without esophagitis: Secondary | ICD-10-CM | POA: Insufficient documentation

## 2015-05-05 DIAGNOSIS — M5441 Lumbago with sciatica, right side: Secondary | ICD-10-CM | POA: Insufficient documentation

## 2015-05-05 DIAGNOSIS — Z79899 Other long term (current) drug therapy: Secondary | ICD-10-CM | POA: Diagnosis not present

## 2015-05-05 MED ORDER — TIZANIDINE HCL 2 MG PO CAPS: 2.0000 mg | ORAL_CAPSULE | Freq: Three times a day (TID) | ORAL | Status: AC | PRN

## 2015-05-05 MED ORDER — OXYCODONE-ACETAMINOPHEN 5-325 MG PO TABS
1.0000 | ORAL_TABLET | Freq: Once | ORAL | Status: AC
  Administered 2015-05-05: 1 via ORAL
  Filled 2015-05-05: qty 1

## 2015-05-05 MED ORDER — OXYCODONE-ACETAMINOPHEN 5-325 MG PO TABS: 1.0000 | ORAL_TABLET | ORAL | Status: AC | PRN

## 2015-05-05 NOTE — ED Provider Notes (Signed)
CSN: [id number]     Arrival date & time 05/05/15  2208 History  This chart was scribed for non-physician practitioner, Elpidio AnisShari Markeeta Scalf, PA-C working with Eber HongBrian Miller, MD, by Jarvis Morganaylor Ferguson, ED Scribe. This patient was seen in room WTR5/WTR5 and the patient's care was started at 11:27 PM.     Chief Complaint  Patient presents with  . Back Pain    The history is provided by the patient. No language interpreter was used.    HPI Comments: [name] is a 44 y.o. female who presents to the Emergency Department complaining of constant, moderate, gradually worsening, lower back pain for 9 days. She notes the pain radiates down her right buttock and into her right thigh. Pt states she went to her PCP previously and was given a Toradol injection. She was prescribed Zanaflex and Tramadol which she has been taking with no relief. Pt notes that last week it was starting to get better but after this weekend it became much worse. She denies any h/o back surgeries but reports history of intermittent back pain. Pt reports she has to sit 12 hours a day for her job. She denies any abdominal pain, urinary or bowel incontinence.  Past Medical History  Diagnosis Date  . Asthma   . GERD (gastroesophageal reflux disease)    Past Surgical History  Procedure Laterality Date  . Cholecystectomy    . Cesarean section    . Hand surgery    . Tonsillectomy      childhood  . Laparoscopic tubal ligation N/A 03/18/2014    Procedure: LAPAROSCOPIC TUBAL LIGATION;  Surgeon: Mickel Baasichard D Kaplan, MD;  Location: [facility];  Service: Gynecology;  Laterality: N/A;   No family history on file. History  Substance Use Topics  . Smoking status: Never Smoker   . Smokeless tobacco: Never Used  . Alcohol Use: No   OB History    No data available     Review of Systems  Gastrointestinal: Negative for abdominal pain.       Negative for bowel incontinence  Genitourinary:       Negative for urinary incontinence  Musculoskeletal:  Positive for back pain.    Allergies  Lobster and Penicillins  Home Medications   Prior to Admission medications   Medication Sig Start Date End Date Taking? Authorizing Provider  albuterol (PROVENTIL HFA;VENTOLIN HFA) 108 (90 BASE) MCG/ACT inhaler Inhale into the lungs every 6 (six) hours as needed for wheezing or shortness of breath.    Historical Provider, MD  HYDROcodone-acetaminophen (NORCO) 5-325 MG per tablet Take 1-2 tablets by mouth every 4 (four) hours as needed for moderate pain. 03/18/14   Ilda Moriichard Kaplan, MD  ibuprofen (ADVIL,MOTRIN) 200 MG tablet Take 200 mg by mouth every 6 (six) hours as needed for moderate pain.    Historical Provider, MD  Multiple Vitamin (MULTIVITAMIN WITH MINERALS) TABS tablet Take 1 tablet by mouth daily.    Historical Provider, MD  pyridOXINE (VITAMIN B-6) 100 MG tablet Take 100 mg by mouth daily.    Historical Provider, MD  ranitidine (ZANTAC) 150 MG tablet Take 150 mg by mouth 2 (two) times daily as needed for heartburn.    Historical Provider, MD  vitamin B-12 (CYANOCOBALAMIN) 1000 MCG tablet Take 1,000 mcg by mouth daily.    Historical Provider, MD   Triage Vitals: BP 134/85 mmHg  Pulse 63  Temp(Src) 97.3 F (36.3 C) (Oral)  Resp 14  SpO2 99%  LMP 04/27/2015 (Approximate)  Physical Exam  Constitutional:  She is oriented to person, place, and time. She appears well-developed and well-nourished. No distress.  HENT:  Head: Normocephalic and atraumatic.  Eyes: Conjunctivae and EOM are normal.  Neck: Neck supple. No tracheal deviation present.  Cardiovascular: Normal rate.   Pulmonary/Chest: Effort normal. No respiratory distress.  Abdominal: Soft. There is no tenderness.  Musculoskeletal: Normal range of motion.  Tender to right para lumbar area. No swelling. Tenderness extends to buttock and hip. SLR right lower extremity positive at 30 degrees. Reflexes are equal in bilateral lower extremities  Neurological: She is alert and oriented to  person, place, and time.  Skin: Skin is warm and dry.  Psychiatric: She has a normal mood and affect. Her behavior is normal.  Nursing note and vitals reviewed.   ED Course  Procedures (including critical care time)  DIAGNOSTIC STUDIES: Oxygen Saturation is 99% on RA, normal by my interpretation.    COORDINATION OF CARE: 11:32 PM- Advised cold compresses, rest and medication. Pt would like a orthopedic referral. - Pt advised of plan for treatment and pt agrees.   Labs Review Labs Reviewed - No data to display  Imaging Review No results found.   EKG Interpretation None      MDM   Final diagnoses:  None    1. Low back pain  Uncomplicated back pain without neurologic deficit. Will provide symptomatic relief and refer to orthopedics for persistent symptoms. VSS.  I personally performed the services described in this documentation, which was scribed in my presence. The recorded information has been reviewed and is accurate.      Elpidio AnisShari Guadalupe Nickless, PA-C 05/09/15 [id number]  Eber HongBrian Miller, MD 05/09/15 [phone]

## 2015-05-05 NOTE — ED Notes (Signed)
Pt presents with c/o lower back pain. Pt reports she injured herself approx 8-9 days ago and was seen at her doctor's office last Wednesday and given medication. Pt reports the pain is not any better although she has been taking the medication.

## 2015-05-05 NOTE — Discharge Instructions (Signed)
Back Exercises °Back exercises help treat and prevent back injuries. The goal of back exercises is to increase the strength of your abdominal and back muscles and the flexibility of your back. These exercises should be started when you no longer have back pain. Back exercises include: °· Pelvic Tilt. Lie on your back with your knees bent. Tilt your pelvis until the lower part of your back is against the floor. Hold this position 5 to 10 sec and repeat 5 to 10 times. °· Knee to Chest. Pull first 1 knee up against your chest and hold for 20 to 30 seconds, repeat this with the other knee, and then both knees. This may be done with the other leg straight or bent, whichever feels better. °· Sit-Ups or Curl-Ups. Bend your knees 90 degrees. Start with tilting your pelvis, and do a partial, slow sit-up, lifting your trunk only 30 to 45 degrees off the floor. Take at least 2 to 3 seconds for each sit-up. Do not do sit-ups with your knees out straight. If partial sit-ups are difficult, simply do the above but with only tightening your abdominal muscles and holding it as directed. °· Hip-Lift. Lie on your back with your knees flexed 90 degrees. Push down with your feet and shoulders as you raise your hips a couple inches off the floor; hold for 10 seconds, repeat 5 to 10 times. °· Back arches. Lie on your stomach, propping yourself up on bent elbows. Slowly press on your hands, causing an arch in your low back. Repeat 3 to 5 times. Any initial stiffness and discomfort should lessen with repetition over time. °· Shoulder-Lifts. Lie face down with arms beside your body. Keep hips and torso pressed to floor as you slowly lift your head and shoulders off the floor. °Do not overdo your exercises, especially in the beginning. Exercises may cause you some mild back discomfort which lasts for a few minutes; however, if the pain is more severe, or lasts for more than 15 minutes, do not continue exercises until you see your caregiver.  Improvement with exercise therapy for back problems is slow.  °See your caregivers for assistance with developing a proper back exercise program. °Document Released: 01/13/2005 Document Revised: 02/28/2012 Document Reviewed: 10/07/2011 °ExitCare® Patient Information ©2015 [employer]. This information is not intended to replace advice given to you by your health care provider. Make sure you discuss any questions you have with your health care provider. ° °Back Pain, Adult °Back pain is very common. The pain often gets better over time. The cause of back pain is usually not dangerous. Most people can learn to manage their back pain on their own.  °HOME CARE  °· Stay active. Start with short walks on flat ground if you can. Try to walk farther each day. °· Do not sit, drive, or stand in one place for more than 30 minutes. Do not stay in bed. °· Do not avoid exercise or work. Activity can help your back heal faster. °· Be careful when you bend or lift an object. Bend at your knees, keep the object close to you, and do not twist. °· Sleep on a firm mattress. Lie on your side, and bend your knees. If you lie on your back, put a pillow under your knees. °· Only take medicines as told by your doctor. °· Put ice on the injured area. °¨ Put ice in a plastic bag. °¨ Place a towel between your skin and the bag. °¨ Leave the   ice on for 15-20 minutes, 03-04 times a day for the first 2 to 3 days. After that, you can switch between ice and heat packs.  Ask your doctor about back exercises or massage.  Avoid feeling anxious or stressed. Find good ways to deal with stress, such as exercise. GET HELP RIGHT AWAY IF:   Your pain does not go away with rest or medicine.  Your pain does not go away in 1 week.  You have new problems.  You do not feel well.  The pain spreads into your legs.  You cannot control when you poop (bowel movement) or pee (urinate).  Your arms or legs feel weak or lose feeling  (numbness).  You feel sick to your stomach (nauseous) or throw up (vomit).  You have belly (abdominal) pain.  You feel like you may pass out (faint). MAKE SURE YOU:   Understand these instructions.  Will watch your condition.  Will get help right away if you are not doing well or get worse. Document Released: 05/24/2008 Document Revised: 02/28/2012 Document Reviewed: 04/09/2014 [facility] Patient Information 2015 [place], [place]. This information is not intended to replace advice given to you by your health care provider. Make sure you discuss any questions you have with your health care provider. Cryotherapy Cryotherapy means treatment with cold. Ice or gel packs can be used to reduce both pain and swelling. Ice is the most helpful within the first 24 to 48 hours after an injury or flare-up from overusing a muscle or joint. Sprains, strains, spasms, burning pain, shooting pain, and aches can all be eased with ice. Ice can also be used when recovering from surgery. Ice is effective, has very few side effects, and is safe for most people to use. PRECAUTIONS  Ice is not a safe treatment option for people with:  Raynaud phenomenon. This is a condition affecting small blood vessels in the extremities. Exposure to cold may cause your problems to return.  Cold hypersensitivity. There are many forms of cold hypersensitivity, including:  Cold urticaria. Red, itchy hives appear on the skin when the tissues begin to warm after being iced.  Cold erythema. This is a red, itchy rash caused by exposure to cold.  Cold hemoglobinuria. Red blood cells break down when the tissues begin to warm after being iced. The hemoglobin that carry oxygen are passed into the urine because they cannot combine with blood proteins fast enough.  Numbness or altered sensitivity in the area being iced. If you have any of the following conditions, do not use ice until you have discussed cryotherapy with your  caregiver:  Heart conditions, such as arrhythmia, angina, or chronic heart disease.  High blood pressure.  Healing wounds or open skin in the area being iced.  Current infections.  Rheumatoid arthritis.  Poor circulation.  Diabetes. Ice slows the blood flow in the region it is applied. This is beneficial when trying to stop inflamed tissues from spreading irritating chemicals to surrounding tissues. However, if you expose your skin to cold temperatures for too long or without the proper protection, you can damage your skin or nerves. Watch for signs of skin damage due to cold. HOME CARE INSTRUCTIONS Follow these tips to use ice and cold packs safely.  Place a dry or damp towel between the ice and skin. A damp towel will cool the skin more quickly, so you may need to shorten the time that the ice is used.  For a more rapid response, add gentle compression  to the ice.  Ice for no more than 10 to 20 minutes at a time. The bonier the area you are icing, the less time it will take to get the benefits of ice.  Check your skin after 5 minutes to make sure there are no signs of a poor response to cold or skin damage.  Rest 20 minutes or more between uses.  Once your skin is numb, you can end your treatment. You can test numbness by very lightly touching your skin. The touch should be so light that you do not see the skin dimple from the pressure of your fingertip. When using ice, most people will feel these normal sensations in this order: cold, burning, aching, and numbness.  Do not use ice on someone who cannot communicate their responses to pain, such as small children or people with dementia. HOW TO MAKE AN ICE PACK Ice packs are the most common way to use ice therapy. Other methods include ice massage, ice baths, and cryosprays. Muscle creams that cause a cold, tingly feeling do not offer the same benefits that ice offers and should not be used as a substitute unless recommended by your  caregiver. To make an ice pack, do one of the following:  Place crushed ice or a bag of frozen vegetables in a sealable plastic bag. Squeeze out the excess air. Place this bag inside another plastic bag. Slide the bag into a pillowcase or place a damp towel between your skin and the bag.  Mix 3 parts water with 1 part rubbing alcohol. Freeze the mixture in a sealable plastic bag. When you remove the mixture from the freezer, it will be slushy. Squeeze out the excess air. Place this bag inside another plastic bag. Slide the bag into a pillowcase or place a damp towel between your skin and the bag. SEEK MEDICAL CARE IF:  You develop white spots on your skin. This may give the skin a blotchy (mottled) appearance.  Your skin turns blue or pale.  Your skin becomes waxy or hard.  Your swelling gets worse. MAKE SURE YOU:   Understand these instructions.  Will watch your condition.  Will get help right away if you are not doing well or get worse. Document Released: 08/02/2011 Document Revised: 04/22/2014 Document Reviewed: 08/02/2011 [facility] Patient Information 2015 [place], [place]. This information is not intended to replace advice given to you by your health care provider. Make sure you discuss any questions you have with your health care provider. Heat Therapy Heat therapy can help ease sore, stiff, injured, and tight muscles and joints. Heat relaxes your muscles, which may help ease your pain.  RISKS AND COMPLICATIONS If you have any of the following conditions, do not use heat therapy unless your health care provider has approved:  Poor circulation.  Healing wounds or scarred skin in the area being treated.  Diabetes, heart disease, or high blood pressure.  Not being able to feel (numbness) the area being treated.  Unusual swelling of the area being treated.  Active infections.  Blood clots.  Cancer.  Inability to communicate pain. This may include young children and people  who have problems with their brain function (dementia).  Pregnancy. Heat therapy should only be used on old, pre-existing, or long-lasting (chronic) injuries. Do not use heat therapy on new injuries unless directed by your health care provider. HOW TO USE HEAT THERAPY There are several different kinds of heat therapy, including:  Moist heat pack.  Warm water bath.  Hot water bottle.  Electric heating pad.  Heated gel pack.  Heated wrap.  Electric heating pad. Use the heat therapy method suggested by your health care provider. Follow your health care provider's instructions on when and how to use heat therapy. GENERAL HEAT THERAPY RECOMMENDATIONS  Do not sleep while using heat therapy. Only use heat therapy while you are awake.  Your skin may turn pink while using heat therapy. Do not use heat therapy if your skin turns red.  Do not use heat therapy if you have new pain.  High heat or long exposure to heat can cause burns. Be careful when using heat therapy to avoid burning your skin.  Do not use heat therapy on areas of your skin that are already irritated, such as with a rash or sunburn. SEEK MEDICAL CARE IF:  You have blisters, redness, swelling, or numbness.  You have new pain.  Your pain is worse. MAKE SURE YOU:  Understand these instructions.  Will watch your condition.  Will get help right away if you are not doing well or get worse. Document Released: 02/28/2012 Document Revised: 04/22/2014 Document Reviewed: 01/29/2014 [facility] Patient Information 2015 [place], [place]. This information is not intended to replace advice given to you by your health care provider. Make sure you discuss any questions you have with your health care provider.

## 2015-06-17 ENCOUNTER — Other Ambulatory Visit: Payer: Self-pay | Admitting: Obstetrics and Gynecology

## 2015-06-19 LAB — CYTOLOGY - PAP

## 2016-11-10 ENCOUNTER — Other Ambulatory Visit: Payer: Self-pay | Admitting: Obstetrics and Gynecology

## 2016-11-10 DIAGNOSIS — R928 Other abnormal and inconclusive findings on diagnostic imaging of breast: Secondary | ICD-10-CM

## 2016-11-17 ENCOUNTER — Ambulatory Visit
Admission: RE | Admit: 2016-11-17 | Discharge: 2016-11-17 | Disposition: A | Discharge status: Home / Self Care | Payer: 59 | Source: Ambulatory Visit | Attending: Obstetrics and Gynecology | Admitting: Obstetrics and Gynecology

## 2016-11-17 DIAGNOSIS — R928 Other abnormal and inconclusive findings on diagnostic imaging of breast: Secondary | ICD-10-CM

## 2017-02-12 DIAGNOSIS — N3000 Acute cystitis without hematuria: Secondary | ICD-10-CM | POA: Diagnosis not present

## 2017-02-12 DIAGNOSIS — N390 Urinary tract infection, site not specified: Secondary | ICD-10-CM | POA: Diagnosis not present

## 2017-04-19 DIAGNOSIS — J45909 Unspecified asthma, uncomplicated: Secondary | ICD-10-CM | POA: Diagnosis not present

## 2017-04-19 DIAGNOSIS — J3081 Allergic rhinitis due to animal (cat) (dog) hair and dander: Secondary | ICD-10-CM | POA: Diagnosis not present

## 2017-04-19 DIAGNOSIS — F5101 Primary insomnia: Secondary | ICD-10-CM | POA: Diagnosis not present

## 2017-06-01 DIAGNOSIS — J45909 Unspecified asthma, uncomplicated: Secondary | ICD-10-CM | POA: Diagnosis not present

## 2018-06-23 ENCOUNTER — Other Ambulatory Visit: Payer: Self-pay

## 2018-06-23 ENCOUNTER — Encounter (HOSPITAL_COMMUNITY): Payer: Self-pay | Admitting: Emergency Medicine

## 2018-06-23 ENCOUNTER — Emergency Department (HOSPITAL_COMMUNITY)
Admission: EM | Admit: 2018-06-23 | Discharge: 2018-06-23 | Disposition: A | Discharge status: Home / Self Care | Payer: 59 | Attending: Emergency Medicine | Admitting: Emergency Medicine

## 2018-06-23 DIAGNOSIS — Z79899 Other long term (current) drug therapy: Secondary | ICD-10-CM | POA: Insufficient documentation

## 2018-06-23 DIAGNOSIS — R110 Nausea: Secondary | ICD-10-CM | POA: Insufficient documentation

## 2018-06-23 DIAGNOSIS — R030 Elevated blood-pressure reading, without diagnosis of hypertension: Secondary | ICD-10-CM | POA: Diagnosis not present

## 2018-06-23 DIAGNOSIS — R202 Paresthesia of skin: Secondary | ICD-10-CM | POA: Diagnosis not present

## 2018-06-23 DIAGNOSIS — R200 Anesthesia of skin: Secondary | ICD-10-CM | POA: Diagnosis not present

## 2018-06-23 LAB — URINALYSIS, ROUTINE W REFLEX MICROSCOPIC
Bilirubin Urine: NEGATIVE
Glucose, UA: NEGATIVE mg/dL
Hgb urine dipstick: NEGATIVE
KETONES UR: 20 mg/dL — AB
Leukocytes, UA: NEGATIVE
NITRITE: NEGATIVE
Protein, ur: NEGATIVE mg/dL
SPECIFIC GRAVITY, URINE: 1.012 (ref 1.005–1.030)
pH: 6 (ref 5.0–8.0)

## 2018-06-23 LAB — BASIC METABOLIC PANEL
Anion gap: 9 (ref 5–15)
BUN: 12 mg/dL (ref 6–20)
CALCIUM: 8.6 mg/dL — AB (ref 8.9–10.3)
CO2: 28 mmol/L (ref 22–32)
Chloride: 103 mmol/L (ref 98–111)
Creatinine, Ser: 0.72 mg/dL (ref 0.44–1.00)
Glucose, Bld: 89 mg/dL (ref 70–99)
POTASSIUM: 3.8 mmol/L (ref 3.5–5.1)
Sodium: 140 mmol/L (ref 135–145)

## 2018-06-23 LAB — CBC WITH DIFFERENTIAL/PLATELET
BASOS ABS: 0 10*3/uL (ref 0.0–0.1)
Basophils Relative: 0 %
Eosinophils Absolute: 0.1 10*3/uL (ref 0.0–0.7)
Eosinophils Relative: 2 %
HCT: 38.8 % (ref 36.0–46.0)
Hemoglobin: 13.3 g/dL (ref 12.0–15.0)
Lymphocytes Relative: 16 %
Lymphs Abs: 1.3 10*3/uL (ref 0.7–4.0)
MCH: 32.7 pg (ref 26.0–34.0)
MCHC: 34.3 g/dL (ref 30.0–36.0)
MCV: 95.3 fL (ref 78.0–100.0)
MONO ABS: 1.2 10*3/uL — AB (ref 0.1–1.0)
Monocytes Relative: 15 %
Neutro Abs: 5.1 10*3/uL (ref 1.7–7.7)
Neutrophils Relative %: 67 %
Platelets: 250 10*3/uL (ref 150–400)
RBC: 4.07 MIL/uL (ref 3.87–5.11)
RDW: 12.3 % (ref 11.5–15.5)
WBC: 7.7 10*3/uL (ref 4.0–10.5)

## 2018-06-23 LAB — POC URINE PREG, ED: Preg Test, Ur: NEGATIVE

## 2018-06-23 NOTE — ED Triage Notes (Signed)
Patient here from home with complaints of hypertension and bilateral facial numbness that started 1 week ago. Reports that she check her BP it twas 150/100, no hx.

## 2018-06-23 NOTE — ED Provider Notes (Signed)
[facility]-EMERGENCY DEPT Provider Note   CSN: [id number] Arrival date & time: 06/23/18  1418     History   Chief Complaint Chief Complaint  Patient presents with  . Hypertension  . Numbness    HPI [name] is a 47 y.o. female.  The history is provided by the patient. No language interpreter was used.  Hypertension    [name] is a 47 y.o. female who presents to the Emergency Department complaining of numbness. She presents to the emergency department with reports of bilateral facial numbness and arm numbness that began earlier today while she was at work. She took her blood pressure at that time and was noted to be 150/100. Her numbness wax and wane throughout the day but never fully resolved. She denies any associated headache, chest pain, shortness of breath, abdominal pain. She does have mild nausea. She has a history of asthma, no additional medical problems. Past Medical History:  Diagnosis Date  . Asthma   . GERD (gastroesophageal reflux disease)     There are no active problems to display for this patient.   Past Surgical History:  Procedure Laterality Date  . CESAREAN SECTION    . CHOLECYSTECTOMY    . HAND SURGERY    . LAPAROSCOPIC TUBAL LIGATION N/A 03/18/2014   Procedure: LAPAROSCOPIC TUBAL LIGATION;  Surgeon: Mickel Baasichard D Kaplan, MD;  Location: [facility];  Service: Gynecology;  Laterality: N/A;  . TONSILLECTOMY     childhood     OB History   None      Home Medications    Prior to Admission medications   Medication Sig Start Date End Date Taking? Authorizing Provider  albuterol (PROVENTIL HFA;VENTOLIN HFA) 108 (90 BASE) MCG/ACT inhaler Inhale into the lungs every 6 (six) hours as needed for wheezing or shortness of breath.   Yes [provider]  ibuprofen (ADVIL,MOTRIN) 200 MG tablet Take 800 mg by mouth every 6 (six) hours as needed for headache or moderate pain.    Yes [provider]  Multiple Vitamin  (MULTIVITAMIN WITH MINERALS) TABS tablet Take 1 tablet by mouth daily.   Yes [provider]  pyridOXINE (VITAMIN B-6) 100 MG tablet Take 100 mg by mouth daily.   Yes [provider]  tiZANidine (ZANAFLEX) 4 MG tablet Take 4 mg by mouth every 6 (six) hours as needed for muscle spasms.   Yes [provider]  vitamin B-12 (CYANOCOBALAMIN) 1000 MCG tablet Take 1,000 mcg by mouth daily.   Yes [provider]  HYDROcodone-acetaminophen (NORCO) 5-325 MG per tablet Take 1-2 tablets by mouth every 4 (four) hours as needed for moderate pain. Patient not taking: Reported on 06/23/2018 03/18/14   Ilda MoriKaplan, Richard, MD  oxyCODONE-acetaminophen (PERCOCET/ROXICET) 5-325 MG per tablet Take 1-2 tablets by mouth every 4 (four) hours as needed for severe pain. Patient not taking: Reported on 06/23/2018 05/05/15   Elpidio AnisUpstill, Shari, PA-C  tizanidine (ZANAFLEX) 2 MG capsule Take 1 capsule (2 mg total) by mouth 3 (three) times daily as needed for muscle spasms. Patient not taking: Reported on 06/23/2018 05/05/15   Elpidio AnisUpstill, Shari, PA-C    Family History No family history on file.  Social History Social History   Tobacco Use  . Smoking status: Never Smoker  . Smokeless tobacco: Never Used  Substance Use Topics  . Alcohol use: No  . Drug use: No     Allergies   Lobster [shellfish allergy] and Penicillins   Review of Systems Review  of Systems  All other systems reviewed and are negative.    Physical Exam Updated Vital Signs BP 118/79   Pulse 87   Temp 98.7 F (37.1 C) (Oral)   Resp (!) 21   Ht 5' 3.5" (1.613 m)   Wt 90.7 kg (200 lb)   SpO2 99%   BMI 34.87 kg/m   Physical Exam  Constitutional: She is oriented to person, place, and time. She appears well-developed and well-nourished.  HENT:  Head: Normocephalic and atraumatic.  Eyes: Pupils are equal, round, and reactive to light. EOM are normal.  Cardiovascular: Normal rate and regular rhythm.  No murmur  heard. Pulmonary/Chest: Effort normal and breath sounds normal. No respiratory distress.  Abdominal: Soft. There is no tenderness. There is no rebound and no guarding.  Musculoskeletal: She exhibits no edema or tenderness.  Neurological: She is alert and oriented to person, place, and time. No cranial nerve deficit.  Five out of five strength in all four extremities with sensation to light touch intact in all four extremities. No pronator drift.  Skin: Skin is warm and dry.  Psychiatric: She has a normal mood and affect. Her behavior is normal.  Nursing note and vitals reviewed.    ED Treatments / Results  Labs (all labs ordered are listed, but only abnormal results are displayed) Labs Reviewed  BASIC METABOLIC PANEL - Abnormal; Notable for the following components:      Result Value   Calcium 8.6 (*)    All other components within normal limits  CBC WITH DIFFERENTIAL/PLATELET - Abnormal; Notable for the following components:   Monocytes Absolute 1.2 (*)    All other components within normal limits  URINALYSIS, ROUTINE W REFLEX MICROSCOPIC - Abnormal; Notable for the following components:   Ketones, ur 20 (*)    All other components within normal limits  URINE CULTURE  POC URINE PREG, ED    EKG None  Radiology No results found.  Procedures Procedures (including critical care time)  Medications Ordered in ED Medications - No data to display   Initial Impression / Assessment and Plan / ED Course  I have reviewed the triage vital signs and the nursing notes.  Pertinent labs & imaging results that were available during my care of the patient were reviewed by me and considered in my medical decision making (see chart for details).     Patient here for paresthesias to entire face, hypertension prior to ED arrival. She is neurologically intact in the emergency department. Presentation is not consistent with hypertensive urgency, CVA. Labs demonstrate normal renal function  electrolytes. Plan to discharge home with outpatient follow-up as well as return precautions.  Final Clinical Impressions(s) / ED Diagnoses   Final diagnoses:  Paresthesia  Elevated blood pressure reading without diagnosis of hypertension    ED Discharge Orders    None       Tilden Fossa, MD 06/24/18 [phone]

## 2018-06-25 LAB — URINE CULTURE: Culture: <10000 — AB

## 2019-01-23 DIAGNOSIS — J3089 Other allergic rhinitis: Secondary | ICD-10-CM | POA: Diagnosis not present

## 2019-01-23 DIAGNOSIS — J4520 Mild intermittent asthma, uncomplicated: Secondary | ICD-10-CM | POA: Diagnosis not present

## 2019-01-23 DIAGNOSIS — J3081 Allergic rhinitis due to animal (cat) (dog) hair and dander: Secondary | ICD-10-CM | POA: Diagnosis not present

## 2019-01-23 DIAGNOSIS — J301 Allergic rhinitis due to pollen: Secondary | ICD-10-CM | POA: Diagnosis not present

## 2019-01-31 DIAGNOSIS — J3081 Allergic rhinitis due to animal (cat) (dog) hair and dander: Secondary | ICD-10-CM | POA: Diagnosis not present

## 2019-01-31 DIAGNOSIS — J301 Allergic rhinitis due to pollen: Secondary | ICD-10-CM | POA: Diagnosis not present

## 2019-02-01 DIAGNOSIS — J3089 Other allergic rhinitis: Secondary | ICD-10-CM | POA: Diagnosis not present

## 2019-03-26 DIAGNOSIS — J301 Allergic rhinitis due to pollen: Secondary | ICD-10-CM | POA: Diagnosis not present

## 2019-03-26 DIAGNOSIS — J3081 Allergic rhinitis due to animal (cat) (dog) hair and dander: Secondary | ICD-10-CM | POA: Diagnosis not present

## 2019-03-26 DIAGNOSIS — J3089 Other allergic rhinitis: Secondary | ICD-10-CM | POA: Diagnosis not present

## 2019-03-28 DIAGNOSIS — J301 Allergic rhinitis due to pollen: Secondary | ICD-10-CM | POA: Diagnosis not present

## 2019-03-28 DIAGNOSIS — J3081 Allergic rhinitis due to animal (cat) (dog) hair and dander: Secondary | ICD-10-CM | POA: Diagnosis not present

## 2019-03-28 DIAGNOSIS — J3089 Other allergic rhinitis: Secondary | ICD-10-CM | POA: Diagnosis not present

## 2019-04-04 DIAGNOSIS — J3081 Allergic rhinitis due to animal (cat) (dog) hair and dander: Secondary | ICD-10-CM | POA: Diagnosis not present

## 2019-04-04 DIAGNOSIS — J3089 Other allergic rhinitis: Secondary | ICD-10-CM | POA: Diagnosis not present

## 2019-04-04 DIAGNOSIS — J301 Allergic rhinitis due to pollen: Secondary | ICD-10-CM | POA: Diagnosis not present

## 2019-04-06 DIAGNOSIS — J3081 Allergic rhinitis due to animal (cat) (dog) hair and dander: Secondary | ICD-10-CM | POA: Diagnosis not present

## 2019-04-06 DIAGNOSIS — J3089 Other allergic rhinitis: Secondary | ICD-10-CM | POA: Diagnosis not present

## 2019-04-06 DIAGNOSIS — J301 Allergic rhinitis due to pollen: Secondary | ICD-10-CM | POA: Diagnosis not present

## 2019-04-11 DIAGNOSIS — J3089 Other allergic rhinitis: Secondary | ICD-10-CM | POA: Diagnosis not present

## 2019-04-11 DIAGNOSIS — J3081 Allergic rhinitis due to animal (cat) (dog) hair and dander: Secondary | ICD-10-CM | POA: Diagnosis not present

## 2019-04-11 DIAGNOSIS — J301 Allergic rhinitis due to pollen: Secondary | ICD-10-CM | POA: Diagnosis not present

## 2019-04-17 DIAGNOSIS — J3081 Allergic rhinitis due to animal (cat) (dog) hair and dander: Secondary | ICD-10-CM | POA: Diagnosis not present

## 2019-04-17 DIAGNOSIS — J301 Allergic rhinitis due to pollen: Secondary | ICD-10-CM | POA: Diagnosis not present

## 2019-04-17 DIAGNOSIS — J3089 Other allergic rhinitis: Secondary | ICD-10-CM | POA: Diagnosis not present

## 2019-04-19 DIAGNOSIS — J3089 Other allergic rhinitis: Secondary | ICD-10-CM | POA: Diagnosis not present

## 2019-04-19 DIAGNOSIS — J3081 Allergic rhinitis due to animal (cat) (dog) hair and dander: Secondary | ICD-10-CM | POA: Diagnosis not present

## 2019-04-19 DIAGNOSIS — J301 Allergic rhinitis due to pollen: Secondary | ICD-10-CM | POA: Diagnosis not present

## 2019-04-23 DIAGNOSIS — J3081 Allergic rhinitis due to animal (cat) (dog) hair and dander: Secondary | ICD-10-CM | POA: Diagnosis not present

## 2019-04-23 DIAGNOSIS — J301 Allergic rhinitis due to pollen: Secondary | ICD-10-CM | POA: Diagnosis not present

## 2019-04-23 DIAGNOSIS — J3089 Other allergic rhinitis: Secondary | ICD-10-CM | POA: Diagnosis not present

## 2019-04-25 DIAGNOSIS — J301 Allergic rhinitis due to pollen: Secondary | ICD-10-CM | POA: Diagnosis not present

## 2019-04-25 DIAGNOSIS — J3081 Allergic rhinitis due to animal (cat) (dog) hair and dander: Secondary | ICD-10-CM | POA: Diagnosis not present

## 2019-04-25 DIAGNOSIS — J3089 Other allergic rhinitis: Secondary | ICD-10-CM | POA: Diagnosis not present

## 2022-05-23 ENCOUNTER — Ambulatory Visit (HOSPITAL_COMMUNITY)
Admission: EM | Admit: 2022-05-23 | Discharge: 2022-05-23 | Disposition: A | Discharge status: Home / Self Care | Payer: No Typology Code available for payment source | Attending: Psychiatry | Admitting: Psychiatry

## 2022-05-23 DIAGNOSIS — F332 Major depressive disorder, recurrent severe without psychotic features: Secondary | ICD-10-CM

## 2022-05-23 MED ORDER — TRAZODONE HCL 50 MG PO TABS: 50.0000 mg | ORAL_TABLET | Freq: Every evening | ORAL | 0 refills | Status: AC | PRN

## 2022-05-23 NOTE — Discharge Instructions (Addendum)
The suicide prevention education provided includes the following: Suicide risk factors Suicide prevention and interventions National Suicide Hotline telephone number [facility] assessment telephone number [facility] Emergency Assistance 911 [facility] and/or Residential Mobile Crisis Unit telephone number   Request made of family/significant other to: Remove weapons (e.g., guns, rifles, knives), all items previously/currently identified as safety concern.   Remove drugs/medications (over the counter, prescriptions, illicit drugs), all items previously/currently identified as a safety concern.   Please contact one of the following facilities to start medication management and therapy services:   [facility] [address]  [place], [place] [address] [phone]   [facility]  [address] [place], [place] [address] [phone]  [facility]  [address] [place], [place] [address] [phone]  [facility]  [address]  [place], [place] [address] [phone]  [facility]  [address] [place], [place] [address] [phone]  [facility]  [address],  [place], [place] [address] [phone]

## 2022-05-23 NOTE — ED Notes (Signed)
Patient was discharged by the provider. Patient received discharge instructions with community resources.

## 2022-05-23 NOTE — ED Provider Notes (Signed)
[facility] Medical Screening Exam  Patient Name: [name] MRN: [id number] Date of Evaluation: 05/23/22 Chief Complaint:   Diagnosis:  Final diagnoses:  Severe episode of recurrent major depressive disorder, without psychotic features (HCC)    History of Present illness: [name] is a 51 y.o. female [name] 51 y.o., female patient [facility] accompanied by her niece and her mother with complaints of increased depression and suicidal ideations.  [name], 51 y.o., female patient seen face to face by this provider, consulted with Dr. Lucianne MussKumar; and chart reviewed on 05/23/22.  Per patient she denies being diagnosed with an any past psychiatric illness.  However she states she was prescribed Lexapro roughly 5-6 years ago but did not like how it made her feel so she discontinued the medication.  She has no current outpatient psychiatric services in place.  She does not take any medications.  She denies any previous suicide attempts and she denies any inpatient psychiatric admissions.  On evaluation [name] reports over the past 2 months she has noticed an increase in depression.  She identifies relationship problems, personal problems, and her work as recent stressors.  She lives alone and has 2 dogs that she identifies as protective factors.  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  She likes working from home but admits it has caused her to self isolate.  She endorses occasional alcohol use and denies all other substance use.  She drinks roughly once per week.  Yesterday she states " I drank a lot of beer".  She began to have suicidal ideations.  Her suicidal ideations continued through the morning.  This a.m. she was in the bathtub and was having passive suicidal ideations of getting a knife.  Reports she would never do anything to hurt herself, and attributes some of her SI to the alcohol use.  She identifies her family as her biggest protective  factor.  States her dogs and her family need her.  She is denying SI at this time.  States, "I do not want to be admitted".  She contracts for safety.  She denies any intent, plan, or access to means.  During evaluation [name] is sitting in the assessment room in no acute distress.  She is alert/oriented x4 and cooperative.  She is casually dressed and makes good eye contact.  Her speech is at a normal pace, normal volume and tone.  She is tearful at times during the assessment.  She denies any concerns with appetite but endorses a decrease in sleep.  She sleeps roughly 5-6 hours per night.  She endorses increased depression with feelings of decreased energy, decreased focus, self isolation, helplessness and hopelessness.  She denies AVH/HI/self-harm/cutting.  Objectively she does not appear to be responding to internal/external stimuli.  There is no evidence of psychosis/mania or delusional thought content.  Patient is able to converse coherently without any distractibility or internal preoccupation.  Patient answered questions appropriately.  Discussed admission to the facility based crisis unit and continuous assessment unit, patient declined.  She is adamant that she does not want to be admitted.  She is future oriented and discusses her goals and how she would need to clear being off with her employer so she does not lose her job.  D[facility]ed a [facility] and patient agreed.  A referral was made on patient's behalf.  Educated patient on trazodone 50  mg nightly as needed for sleep.  Patient verbalized understanding and is in agreement.  Collateral: Patient's mother who was not present during the assessment.  Patient did give permission for her mother to come into the assessment room for safety planning.  Patient's mother states that patient will stay at her house and that while she is at work tomorrow there will be other family members in the home.  States patient will have 24/7  monitoring. The plan is to have patient go to [place] with her father and stepmother for a week or 2 to have a change of scenery and to visit.  Mother has no immediate safety concerns with patient being discharged.  Reports there are no firearms/weapons in the home.  Extensive safety planning/prevention was conducted with mother and patient.  Request was made to remove any items that could be identified as a safety concern including OTC medications, prescription medications, knives and sharp objects.  Patient and mother verbalized understanding and are both in agreement  At this time [name] is educated and verbalizes understanding of mental health resources and other crisis services in the community.  She is instructed to call 911 and present to the nearest emergency room should she experience any suicidal/homicidal ideation, auditory/visual/hallucinations, or detrimental worsening of her mental health condition.  She was a also advised by Clinical research associate that she could call the toll-free phone on insurance card to assist with identifying in network counselors and agencies or number on back of Medicaid card t speak with care coordinator    Psychiatric Specialty Exam  Presentation  General Appearance:Appropriate for Environment; Casual  Eye Contact:Good  Speech:Clear and Coherent; Normal Rate  Speech Volume:Normal  Handedness:Right   Mood and Affect  Mood:Anxious; Depressed  Affect:Tearful; Depressed; Congruent   Thought Process  Thought Processes:Coherent  Descriptions of Associations:Intact  Orientation:Full (Time, Place and Person)  Thought Content:Logical    Hallucinations:None  Ideas of Reference:None  Suicidal Thoughts:Yes, Passive Without Intent; Without Plan; Without Means to Carry Out; Without Access to Means  Homicidal Thoughts:No   Sensorium  Memory:Immediate Good; Recent Good; Remote Good  Judgment:Fair  Insight:Good   Executive Functions   Concentration:Good  Attention Span:Good  Recall:Good  Fund of Knowledge:Good  Language:Good   Psychomotor Activity  Psychomotor Activity:Normal   Assets  Assets:Communication Skills; Desire for Improvement; Financial Resources/Insurance; Housing; Physical Health; Resilience; Social Support; Vocational/Educational   Sleep  Sleep:Fair  Number of hours: 5   No data recorded  Physical Exam: Physical Exam Vitals and nursing note reviewed.  Constitutional:      General: She is not in acute distress.    Appearance: Normal appearance. She is not ill-appearing.  HENT:     Head: Normocephalic.  Eyes:     General:        Right eye: No discharge.        Left eye: No discharge.     Conjunctiva/sclera: Conjunctivae normal.  Cardiovascular:     Rate and Rhythm: Normal rate.  Pulmonary:     Effort: Pulmonary effort is normal.  Musculoskeletal:        General: Normal range of motion.     Cervical back: Normal range of motion.  Skin:    Coloration: Skin is not jaundiced or pale.  Neurological:     Mental Status: She is alert and oriented to person, place, and time.  Psychiatric:        Attention and Perception: Attention and perception normal.  Mood and Affect: Mood is anxious and depressed. Affect is tearful.        Speech: Speech normal.        Behavior: Behavior normal. Behavior is cooperative.        Thought Content: Thought content includes suicidal ideation. Thought content does not include suicidal plan.        Cognition and Memory: Cognition normal.        Judgment: Judgment normal.   Review of Systems  Constitutional: Negative.   HENT: Negative.    Eyes: Negative.   Respiratory: Negative.    Cardiovascular: Negative.   Musculoskeletal: Negative.   Skin: Negative.   Neurological: Negative.   Psychiatric/Behavioral:  Positive for depression and suicidal ideas. The patient is nervous/anxious.   Blood pressure (!) 151/107, pulse 100, temperature 98.5  F (36.9 C), temperature source Oral, resp. rate 18, SpO2 98 %. There is no height or weight on file to calculate BMI.  Musculoskeletal: Strength & Muscle Tone: within normal limits Gait & Station: normal Patient leans: N/A   [facility] MSE Discharge Disposition for Follow up and Recommendations: Based on my evaluation the patient does not appear to have an emergency medical condition and can be discharged with resources and follow up care in outpatient services for Medication Management, Partial Hospitalization Program, and Individual Therapy  Discharge patient  Safety planning/prevention completed with patient and her mother.  Trazodone 50 mg nightly as needed 14-day supply sent to patient's pharmacy for sleep.  Referral made to [facility] program.  No evidence of imminent risk to self or others at present.    Patient does not meet criteria for psychiatric inpatient admission. Discussed crisis plan, support from social network, calling 911, coming to the Emergency Department, and calling Suicide Hotline.   Ardis Hughs, NP 05/23/2022, 6:06 PM

## 2022-05-23 NOTE — ED Triage Notes (Signed)
Pt presents to [facility] accompanied by her mother and niece due to worsening depression symptoms and SI. Pt states that she has been experiencing depression symptoms for the past 2 months due to multiple stressors including work, relationship and other personal issues that she wishes not to elaborate on. Pt states that today she was feeling overwhelmed and thought about getting in the bathtub with a knife. Pt states that she would not follow through with this plan because her family and her dogs need her. Pt states she has a good support system and her friend "talked her down" from following through with her plan. Pt reports that she is willing to stay with her mother who is present to maintain safety. Pt states "I don't want to be admitted". Pt denies any prior attempts. Pt denies any pyschiatric diagnosis. Pt denies any past hospitalizations. Pt denies having a therapist or psychiatrist. Pt denies HI and AVH.

## 2022-05-24 ENCOUNTER — Telehealth (HOSPITAL_COMMUNITY): Payer: Self-pay | Admitting: Professional

## 2022-05-25 ENCOUNTER — Ambulatory Visit (HOSPITAL_COMMUNITY): Payer: No Typology Code available for payment source | Admitting: Professional

## 2022-12-24 ENCOUNTER — Emergency Department (HOSPITAL_BASED_OUTPATIENT_CLINIC_OR_DEPARTMENT_OTHER): Payer: No Typology Code available for payment source

## 2022-12-24 ENCOUNTER — Other Ambulatory Visit: Payer: Self-pay

## 2022-12-24 ENCOUNTER — Emergency Department (HOSPITAL_BASED_OUTPATIENT_CLINIC_OR_DEPARTMENT_OTHER)
Admission: EM | Admit: 2022-12-24 | Discharge: 2022-12-24 | Disposition: A | Discharge status: Home / Self Care | Payer: No Typology Code available for payment source | Attending: Emergency Medicine | Admitting: Emergency Medicine

## 2022-12-24 DIAGNOSIS — W540XXA Bitten by dog, initial encounter: Secondary | ICD-10-CM | POA: Diagnosis not present

## 2022-12-24 DIAGNOSIS — R600 Localized edema: Secondary | ICD-10-CM | POA: Diagnosis not present

## 2022-12-24 DIAGNOSIS — S41151A Open bite of right upper arm, initial encounter: Secondary | ICD-10-CM | POA: Insufficient documentation

## 2022-12-24 DIAGNOSIS — Z23 Encounter for immunization: Secondary | ICD-10-CM | POA: Diagnosis not present

## 2022-12-24 DIAGNOSIS — S4991XA Unspecified injury of right shoulder and upper arm, initial encounter: Secondary | ICD-10-CM | POA: Diagnosis present

## 2022-12-24 MED ORDER — OXYCODONE-ACETAMINOPHEN 5-325 MG PO TABS
1.0000 | ORAL_TABLET | Freq: Once | ORAL | Status: AC
  Administered 2022-12-24: 1 via ORAL
  Filled 2022-12-24: qty 1

## 2022-12-24 MED ORDER — SULFAMETHOXAZOLE-TRIMETHOPRIM 800-160 MG PO TABS: 1.0000 | ORAL_TABLET | Freq: Two times a day (BID) | ORAL | 0 refills | Status: AC

## 2022-12-24 MED ORDER — SULFAMETHOXAZOLE-TRIMETHOPRIM 800-160 MG PO TABS
1.0000 | ORAL_TABLET | Freq: Once | ORAL | Status: AC
  Administered 2022-12-24: 1 via ORAL
  Filled 2022-12-24: qty 1

## 2022-12-24 MED ORDER — HYDROCODONE-ACETAMINOPHEN 5-325 MG PO TABS: 1.0000 | ORAL_TABLET | Freq: Four times a day (QID) | ORAL | 0 refills | Status: AC | PRN

## 2022-12-24 MED ORDER — TETANUS-DIPHTH-ACELL PERTUSSIS 5-2.5-18.5 LF-MCG/0.5 IM SUSY
0.5000 mL | PREFILLED_SYRINGE | Freq: Once | INTRAMUSCULAR | Status: AC
  Administered 2022-12-24: 0.5 mL via INTRAMUSCULAR
  Filled 2022-12-24: qty 0.5

## 2022-12-24 MED ORDER — METRONIDAZOLE 500 MG PO TABS: 500.0000 mg | ORAL_TABLET | Freq: Three times a day (TID) | ORAL | 0 refills | Status: AC

## 2022-12-24 MED ORDER — METRONIDAZOLE 500 MG PO TABS
500.0000 mg | ORAL_TABLET | Freq: Once | ORAL | Status: AC
  Administered 2022-12-24: 500 mg via ORAL
  Filled 2022-12-24: qty 1

## 2022-12-24 NOTE — ED Triage Notes (Addendum)
Dog bite to R arm from patient's own puppy. Three puncture wounds to R forearm with bleeding controlled prior to arrival.  Tetanus >10 yrs ago Her puppy is fully vaccinated. Endorses tingling in hand, cap refill<3sec, good radial, reduced ROM of wrist Wound cleansed under running water and covered in clean gauze in triage.

## 2022-12-24 NOTE — ED Provider Notes (Signed)
[facility] EMERGENCY DEPT Provider Note   CSN: [id number] Arrival date & time: 12/24/22  1814     History  Chief Complaint  Patient presents with   Animal Bite    [name] is a 52 y.o. female, history of tubal ligation, who presents to the ED secondary to right arm pain after trying to break up a dog fight at her house.  She states her dogs were fighting, and she attempted to break the fight up, and one of her dogs but is mouth around her right wrist, and she has severe pain and swelling from it.  Her last tetanus shot is more than 10 years ago, but she states that her dogs are up-to-date on their vaccinations.  Last menstrual period was 2 weeks prior.  She states that her right hand and arm feel very numb and tingly, and she notes that EXTR is extremely swollen.  She has tried apply some ice, without relief of her symptoms.  She states that her pain is very bad.     Home Medications Prior to Admission medications   Medication Sig Start Date End Date Taking? Authorizing Provider  HYDROcodone-acetaminophen (NORCO) 5-325 MG tablet Take 1 tablet by mouth every 6 (six) hours as needed for moderate pain. 12/24/22  Yes Ermie Glendenning L, PA  metroNIDAZOLE (FLAGYL) 500 MG tablet Take 1 tablet (500 mg total) by mouth 3 (three) times daily for 5 days. 12/24/22 12/29/22 Yes Ailin Rochford L, PA  sulfamethoxazole-trimethoprim (BACTRIM DS) 800-160 MG tablet Take 1 tablet by mouth 2 (two) times daily for 5 days. 12/24/22 12/29/22 Yes Alira Fretwell L, PA  albuterol (PROVENTIL HFA;VENTOLIN HFA) 108 (90 BASE) MCG/ACT inhaler Inhale into the lungs every 6 (six) hours as needed for wheezing or shortness of breath.    [provider]  ibuprofen (ADVIL,MOTRIN) 200 MG tablet Take 800 mg by mouth every 6 (six) hours as needed for headache or moderate pain.     [provider]  Multiple Vitamin (MULTIVITAMIN WITH MINERALS) TABS tablet Take 1 tablet by mouth daily.    [provider]  oxyCODONE-acetaminophen (PERCOCET/ROXICET) 5-325 MG per tablet Take 1-2 tablets by mouth every 4 (four) hours as needed for severe pain. Patient not taking: Reported on 06/23/2018 05/05/15   Charlann Lange, PA-C  pyridOXINE (VITAMIN B-6) 100 MG tablet Take 100 mg by mouth daily.    [provider]  tizanidine (ZANAFLEX) 2 MG capsule Take 1 capsule (2 mg total) by mouth 3 (three) times daily as needed for muscle spasms. Patient not taking: Reported on 06/23/2018 05/05/15   Charlann Lange, PA-C  tiZANidine (ZANAFLEX) 4 MG tablet Take 4 mg by mouth every 6 (six) hours as needed for muscle spasms.    [provider]  traZODone (DESYREL) 50 MG tablet Take 1 tablet (50 mg total) by mouth at bedtime as needed for sleep. 05/23/22   Revonda Humphrey, NP  vitamin B-12 (CYANOCOBALAMIN) 1000 MCG tablet Take 1,000 mcg by mouth daily.    [provider]      Allergies    Lobster [shellfish allergy] and Penicillins    Review of Systems   Review of Systems  Skin:  Positive for wound. Negative for rash.    Physical Exam Updated Vital Signs BP (!) 164/107 (BP Location: Left Arm)   Pulse 82   Temp 98 F (36.7 C)   Resp 18   Wt 90 kg   LMP 12/08/2022 Comment: Tubal Ligation  SpO2 100%  BMI 34.60 kg/m  Physical Exam Vitals and nursing note reviewed.  Constitutional:      General: She is not in acute distress.    Appearance: She is well-developed.  HENT:     Head: Normocephalic and atraumatic.  Eyes:     Conjunctiva/sclera: Conjunctivae normal.  Cardiovascular:     Rate and Rhythm: Normal rate and regular rhythm.     Pulses: Normal pulses.          Radial pulses are 2+ on the right side.  Pulmonary:     Effort: Pulmonary effort is normal.  Abdominal:     Palpations: Abdomen is soft.     Tenderness: There is no abdominal tenderness.  Musculoskeletal:        General: Swelling present.     Cervical back: Neck supple.     Comments: Edema to right wrist  w/diffuse ttp of lower right arm and right hand. No focal tenderness. ROM Intact, but difficult to achieve 2/2 to pain.  Skin:    General: Skin is warm.     Capillary Refill: Capillary refill takes less than 2 seconds.     Comments: +3 puncture wounds on RUE, along R wrist/lower arm  Neurological:     Mental Status: She is alert.  Psychiatric:        Mood and Affect: Mood normal.     ED Results / Procedures / Treatments   Labs (all labs ordered are listed, but only abnormal results are displayed) Labs Reviewed - No data to display   EKG None  Radiology DG Wrist Complete Right  Result Date: 12/24/2022 CLINICAL DATA:  Dog bite EXAM: RIGHT WRIST - COMPLETE 3+ VIEW COMPARISON:  None Available. FINDINGS: There is no evidence of fracture or dislocation. There is no evidence of arthropathy or other focal bone abnormality. There is soft tissue swelling over the dorsum of the wrist. There is no foreign body. IMPRESSION: Soft tissue swelling over the dorsum of the wrist. No fracture or foreign body. Electronically Signed   By: Ronney Asters M.D.   On: 12/24/2022 19:28    Procedures Procedures   Medications Ordered in ED Medications  Tdap (BOOSTRIX) injection 0.5 mL (has no administration in time range)  oxyCODONE-acetaminophen (PERCOCET/ROXICET) 5-325 MG per tablet 1 tablet (has no administration in time range)  sulfamethoxazole-trimethoprim (BACTRIM DS) 800-160 MG per tablet 1 tablet (has no administration in time range)  metroNIDAZOLE (FLAGYL) tablet 500 mg (has no administration in time range)    ED Course/ Medical Decision Making/ A&P                           Medical Decision Making Patient is a 52 year old female, here for wound to her right arm after her puppy bit her while she was trying to break up a dog fight.  Her puppy is vaccinated, but the patient has not had a tetanus shot in 10+ years.  We will obtain an x-ray of her wrist as it is swelling, and administer tetanus shot.   She states she is allergic to amoxicillin/penicillins and cannot take them under any circumstances.  We will give her metronidazole and Bactrim for  antibiotics.  Give her Norco for pain control.  Amount and/or Complexity of Data Reviewed Labs: ordered. Radiology: ordered.    Details: Noted edema to wrist, no fracture Discussion of management or test interpretation with external provider(s): Patient is a 52 year old female, here for a dog bite, it is  extremely swollen, and she does report that she has some numbness tingling of her fingers, she does get good sensation when I do check for it, and good capillary refill and a radial pulse.  She is diffusely tender and there is no focal tenderness.  Her x-ray was unremarkable.  We updated her tetanus shot, and gave her antibiotics to prevent infection.  She was advised on return precautions, and given a short dose of Norco given the severity of her swelling, and pain.  I advised her to follow-up with her primary care, I believe that she is having some numbness/tingling secondary to swelling, but she may have some nerve damage, it is unclear at this time given the degree of swelling.  She should follow-up with her primary care doctor, and may need to follow-up with orthopedics, she voiced understanding and does not request any referral to orthopedics as she will follow-up with her PCP.  Risk Prescription drug management.    Final Clinical Impression(s) / ED Diagnoses Final diagnoses:  Dog bite of right arm, initial encounter    Rx / DC Orders ED Discharge Orders          Ordered    metroNIDAZOLE (FLAGYL) 500 MG tablet  3 times daily        12/24/22 2320    sulfamethoxazole-trimethoprim (BACTRIM DS) 800-160 MG tablet  2 times daily        12/24/22 2320    HYDROcodone-acetaminophen (NORCO) 5-325 MG tablet  Every 6 hours PRN        12/24/22 2320              Keymari Sato, Harley Alto, PA 12/24/22 2330    Terrilee Files, MD 12/25/22 1105

## 2022-12-24 NOTE — Discharge Instructions (Signed)
Please follow-up with your primary care doctor, I will prescribe you some pain medications to help with the pain and swelling, you should also take ibuprofen to help with the pain and swelling.  Make sure you are taking the antibiotics as prescribed, and avoid using alcohol as this may induce vomiting with the Flagyl.  If you have worsening pain, your hand loses color, you have increased swelling, or redness of your arm please return to the ER.
